# Patient Record
Sex: Female | Born: 1955 | Race: White | Hispanic: No
Health system: Western US, Academic
[De-identification: ages and names within clinical notes are randomized; demographics above are authoritative.]

## PROBLEM LIST (undated history)

## (undated) DIAGNOSIS — I10 Essential (primary) hypertension: Secondary | ICD-10-CM

## (undated) HISTORY — DX: Essential (primary) hypertension: I10

---

## 2018-02-05 ENCOUNTER — Other Ambulatory Visit: Payer: Self-pay | Admitting: Registered Nurse

## 2018-02-05 DIAGNOSIS — M79601 Pain in right arm: Principal | ICD-10-CM

## 2018-02-18 ENCOUNTER — Ambulatory Visit
Admission: RE | Admit: 2018-02-18 | Discharge: 2018-02-18 | Disposition: A | Discharge status: Home / Self Care | Source: Ambulatory Visit | Attending: Registered Nurse | Admitting: Registered Nurse

## 2018-02-18 DIAGNOSIS — M79601 Pain in right arm: Principal | ICD-10-CM

## 2018-04-15 ENCOUNTER — Other Ambulatory Visit: Payer: Self-pay | Admitting: Registered Nurse

## 2018-04-15 DIAGNOSIS — Z1239 Encounter for other screening for malignant neoplasm of breast: Principal | ICD-10-CM

## 2018-04-15 DIAGNOSIS — R221 Localized swelling, mass and lump, neck: Principal | ICD-10-CM

## 2018-04-29 ENCOUNTER — Ambulatory Visit
Admission: RE | Admit: 2018-04-29 | Discharge: 2018-04-29 | Disposition: A | Discharge status: Home / Self Care | Source: Ambulatory Visit

## 2018-04-29 ENCOUNTER — Ambulatory Visit
Admission: RE | Admit: 2018-04-29 | Discharge: 2018-04-29 | Disposition: A | Discharge status: Home / Self Care | Source: Ambulatory Visit | Attending: Registered Nurse | Admitting: Registered Nurse

## 2018-04-29 DIAGNOSIS — Z1239 Encounter for other screening for malignant neoplasm of breast: Principal | ICD-10-CM

## 2018-04-29 DIAGNOSIS — R221 Localized swelling, mass and lump, neck: Secondary | ICD-10-CM

## 2018-04-30 ENCOUNTER — Other Ambulatory Visit: Payer: Self-pay | Admitting: Registered Nurse

## 2018-04-30 DIAGNOSIS — N6320 Unspecified lump in the left breast, unspecified quadrant: Principal | ICD-10-CM

## 2018-04-30 DIAGNOSIS — E041 Nontoxic single thyroid nodule: Principal | ICD-10-CM

## 2018-05-06 NOTE — Nurse Focus (Signed)
Pre procedure call for thyroid biopsy, reminded to wear comfortable clothes, diet light lunch prior to procedure time, blood thinners not taking any , okay to take all other medications, arrive to [facility] 30 minutes prior to procedure time, sedation not needed, driver will be pt, lab not needed per protocol, procedure explained to pt, questions addressed, verbalizes understanding, given our call back number [phone] to call us with any further questions or concerns.

## 2018-05-07 ENCOUNTER — Ambulatory Visit
Admission: RE | Admit: 2018-05-07 | Discharge: 2018-05-07 | Disposition: A | Discharge status: Home / Self Care | Source: Ambulatory Visit | Attending: DIAGNOSTIC RADIOLOGY | Admitting: DIAGNOSTIC RADIOLOGY

## 2018-05-07 ENCOUNTER — Ambulatory Visit
Admission: RE | Admit: 2018-05-07 | Discharge: 2018-05-07 | Disposition: A | Discharge status: Home / Self Care | Source: Ambulatory Visit

## 2018-05-07 DIAGNOSIS — E041 Nontoxic single thyroid nodule: Principal | ICD-10-CM

## 2018-05-07 MED ORDER — SODIUM BICARBONATE 1 MEQ/ML (8.4 %) INTRAVENOUS SOLUTION
50.0000 meq | Freq: Once | INTRAVENOUS | Status: AC
Start: 2018-05-07 — End: 2018-05-07
  Administered 2018-05-07: 0.5 meq
  Filled 2018-05-07: qty 50, fill #0

## 2018-05-07 MED ORDER — LIDOCAINE HCL 10 MG/ML (1 %) INJECTION SOLUTION
0.5000 mL | Freq: Once | INTRAMUSCULAR | Status: AC
Start: 2018-05-07 — End: 2018-05-07
  Administered 2018-05-07: 5 mL

## 2018-05-07 NOTE — Nurse Discharge Note (Signed)
Patient tolerated thyroid bx well. Dressing per protocol. Ice pack applied Patient free from unusual  Pain/nausea/bleeding. Discharge instructions given and understood.   Discharged home ambulatory.

## 2018-05-07 NOTE — Nurse Focus (Signed)
Pt seen by md consent obtained.

## 2018-05-08 ENCOUNTER — Ambulatory Visit
Admission: RE | Admit: 2018-05-08 | Discharge: 2018-05-08 | Disposition: A | Discharge status: Home / Self Care | Source: Ambulatory Visit | Attending: Registered Nurse | Admitting: Registered Nurse

## 2018-05-08 DIAGNOSIS — N6320 Unspecified lump in the left breast, unspecified quadrant: Principal | ICD-10-CM

## 2018-05-08 NOTE — Nurse Focus (Addendum)
Post thyroid biopsy call, stated doing well, denies fever or bleeding, satisfied with care received here, Pt reports neck discomfort with turning and sore throat. Pt denies swelling or bruising. Pt speaking on phone without difficulty. Pt advised to use ice 20 minutes on and 10 minutes off, as needed for pain. Additionally, pt advised to use throat lozenges for sore throat. Pt aware, if there are any further concerns please contact your primary care doctor.

## 2018-09-02 ENCOUNTER — Other Ambulatory Visit: Payer: Self-pay | Admitting: Registered Nurse

## 2018-09-02 DIAGNOSIS — J449 Chronic obstructive pulmonary disease, unspecified: Principal | ICD-10-CM

## 2018-09-10 ENCOUNTER — Other Ambulatory Visit: Payer: Self-pay | Admitting: Registered Nurse

## 2018-09-10 ENCOUNTER — Ambulatory Visit
Admission: RE | Admit: 2018-09-10 | Discharge: 2018-09-10 | Disposition: A | Discharge status: Home / Self Care | Source: Ambulatory Visit | Attending: Registered Nurse | Admitting: Registered Nurse

## 2018-09-10 DIAGNOSIS — J449 Chronic obstructive pulmonary disease, unspecified: Principal | ICD-10-CM

## 2018-09-18 ENCOUNTER — Ambulatory Visit
Admission: RE | Admit: 2018-09-18 | Discharge: 2018-09-18 | Disposition: A | Discharge status: Home / Self Care | Source: Ambulatory Visit | Attending: Registered Nurse | Admitting: Registered Nurse

## 2018-09-18 DIAGNOSIS — J449 Chronic obstructive pulmonary disease, unspecified: Principal | ICD-10-CM

## 2018-09-18 MED ORDER — ALBUTEROL SULFATE CONCENTRATE 2.5 MG/0.5 ML SOLUTION FOR NEBULIZATION
2.5000 mg | INHALATION_SOLUTION | RESPIRATORY_TRACT | Status: AC | PRN
Start: 2018-09-18 — End: 2018-09-18
  Administered 2018-09-18: 2.5 mg via RESPIRATORY_TRACT
  Filled 2018-09-18: qty 0.5, fill #0

## 2018-09-18 MED ORDER — SODIUM CHLORIDE 0.9 % FOR NEBULIZATION
3.0000 mL | INHALATION_SOLUTION | RESPIRATORY_TRACT | Status: AC | PRN
Start: 2018-09-18 — End: 2018-09-18
  Administered 2018-09-18: 3 mL via RESPIRATORY_TRACT
  Filled 2018-09-18: qty 3, fill #0

## 2018-10-11 NOTE — Procedures (Signed)
Pulmonary function testing    Date: September 18, 2018    Diagnosis: Chronic obstructive pulmonary disease    The FVC is 2.09 L or 68% of predicted.  The FEV1 is 1.7 L or 70% of predicted.  After albuterol by nebulizer the FEV1 improved by 10% and the FEF 25-75% improved by 54%.  This is significant improvement.  The flow volume loops show a mild to moderate restrictive pattern.  The total lung capacity is 4.56 L or 90% of predicted.  The residual volume is 2.46 L or 120% of predicted.  The diffusion capacity is 17.8 or 89% of predicted.    Impression: Moderate restrictive pattern spirometry with significant improvement after bronchodilator.  Lung volume testing and diffusion capacity testing are normal.  This may represent the double diagnoses of COPD and obesity.

## 2019-02-02 ENCOUNTER — Other Ambulatory Visit: Payer: Self-pay | Admitting: Registered Nurse

## 2019-02-02 DIAGNOSIS — R42 Dizziness and giddiness: Principal | ICD-10-CM

## 2019-04-30 ENCOUNTER — Other Ambulatory Visit: Payer: Self-pay | Admitting: Registered Nurse

## 2019-04-30 DIAGNOSIS — H8109 Meniere's disease, unspecified ear: Secondary | ICD-10-CM

## 2019-06-16 ENCOUNTER — Other Ambulatory Visit: Payer: Self-pay | Admitting: Registered Nurse

## 2019-06-16 DIAGNOSIS — E041 Nontoxic single thyroid nodule: Secondary | ICD-10-CM

## 2019-07-06 ENCOUNTER — Other Ambulatory Visit: Payer: Self-pay | Admitting: Registered Nurse

## 2019-07-06 DIAGNOSIS — R42 Dizziness and giddiness: Secondary | ICD-10-CM

## 2019-07-27 ENCOUNTER — Other Ambulatory Visit: Payer: Self-pay

## 2019-07-27 ENCOUNTER — Other Ambulatory Visit: Payer: Self-pay | Admitting: Registered Nurse

## 2019-07-27 DIAGNOSIS — Z1231 Encounter for screening mammogram for malignant neoplasm of breast: Secondary | ICD-10-CM

## 2019-09-03 ENCOUNTER — Ambulatory Visit: Admitting: Family

## 2019-09-03 DIAGNOSIS — Z20828 Contact with and (suspected) exposure to other viral communicable diseases: Secondary | ICD-10-CM

## 2019-10-02 ENCOUNTER — Ambulatory Visit
Admission: RE | Admit: 2019-10-02 | Discharge: 2019-10-02 | Disposition: A | Discharge status: Home / Self Care | Source: Ambulatory Visit | Attending: Registered Nurse | Admitting: Registered Nurse

## 2019-10-02 DIAGNOSIS — E041 Nontoxic single thyroid nodule: Secondary | ICD-10-CM | POA: Insufficient documentation

## 2019-12-28 ENCOUNTER — Other Ambulatory Visit: Payer: Self-pay | Admitting: Registered Nurse

## 2019-12-28 DIAGNOSIS — S86911A Strain of unspecified muscle(s) and tendon(s) at lower leg level, right leg, initial encounter: Secondary | ICD-10-CM

## 2019-12-29 ENCOUNTER — Ambulatory Visit
Admission: RE | Admit: 2019-12-29 | Discharge: 2019-12-29 | Disposition: A | Discharge status: Home / Self Care | Source: Ambulatory Visit | Attending: Registered Nurse | Admitting: Registered Nurse

## 2019-12-29 DIAGNOSIS — S86911A Strain of unspecified muscle(s) and tendon(s) at lower leg level, right leg, initial encounter: Secondary | ICD-10-CM | POA: Insufficient documentation

## 2020-04-27 ENCOUNTER — Other Ambulatory Visit: Payer: Self-pay | Admitting: Registered Nurse

## 2020-04-27 DIAGNOSIS — M79601 Pain in right arm: Secondary | ICD-10-CM

## 2020-05-05 ENCOUNTER — Ambulatory Visit

## 2020-07-04 ENCOUNTER — Other Ambulatory Visit: Payer: Self-pay | Admitting: Registered Nurse

## 2020-07-04 DIAGNOSIS — M79602 Pain in left arm: Secondary | ICD-10-CM

## 2020-07-05 ENCOUNTER — Ambulatory Visit
Admission: RE | Admit: 2020-07-05 | Discharge: 2020-07-05 | Disposition: A | Discharge status: Home / Self Care | Source: Ambulatory Visit | Attending: Registered Nurse | Admitting: Registered Nurse

## 2020-07-05 DIAGNOSIS — M79602 Pain in left arm: Secondary | ICD-10-CM | POA: Insufficient documentation

## 2020-07-13 LAB — CBC AND DIFFERENTIAL
HCT: 43 (ref 36–46)
Hemoglobin: 15.3 (ref 12.0–16.0)
Platelets: 255 (ref 150–399)
WBC: 9.7

## 2020-07-13 LAB — BASIC METABOLIC PANEL
BUN: 14 (ref 4–21)
CO2: 33 — AB (ref 13–22)
Chloride: 101 (ref 99–108)
Creatinine: 0.8 (ref <=1.1)
Glucose: 110
Potassium: 4.3 (ref 3.4–5.3)
Sodium: 139 (ref 137–147)

## 2020-07-13 LAB — CBC: RBC: 4.9 (ref 3.87–5.11)

## 2020-07-13 LAB — LIPID PANEL
Cholesterol: 215 — AB (ref 0–200)
Triglycerides: 103 (ref 40–160)

## 2020-07-13 LAB — COMPREHENSIVE METABOLIC PANEL
Albumin: 4.3 (ref 3.5–5.0)
Calcium: 9.6 (ref 8.7–10.7)
Globulin: 2.8

## 2020-07-13 LAB — HEPATIC FUNCTION PANEL
ALT: 17 (ref 7–35)
AST: 18 (ref 13–35)
Alkaline Phosphatase: 91 (ref 25–125)
Bilirubin, Total: 0.8

## 2020-08-16 ENCOUNTER — Other Ambulatory Visit: Payer: Self-pay | Admitting: Registered Nurse

## 2020-08-16 DIAGNOSIS — Z1231 Encounter for screening mammogram for malignant neoplasm of breast: Secondary | ICD-10-CM

## 2020-08-23 ENCOUNTER — Ambulatory Visit: Payer: Medicare Other

## 2020-08-25 ENCOUNTER — Ambulatory Visit: Payer: Medicare Other

## 2020-09-12 ENCOUNTER — Ambulatory Visit
Admission: RE | Admit: 2020-09-12 | Discharge: 2020-09-12 | Disposition: A | Discharge status: Home / Self Care | Payer: Medicare Other | Source: Ambulatory Visit | Attending: Registered Nurse | Admitting: Registered Nurse

## 2020-09-12 DIAGNOSIS — Z1231 Encounter for screening mammogram for malignant neoplasm of breast: Secondary | ICD-10-CM | POA: Insufficient documentation

## 2021-09-04 ENCOUNTER — Ambulatory Visit: Payer: Self-pay | Admitting: Nurse Practitioner

## 2021-09-18 ENCOUNTER — Other Ambulatory Visit: Payer: Self-pay

## 2021-09-18 ENCOUNTER — Ambulatory Visit (INDEPENDENT_AMBULATORY_CARE_PROVIDER_SITE_OTHER): Payer: Medicare HMO | Admitting: Nurse Practitioner

## 2021-09-18 ENCOUNTER — Encounter: Payer: Self-pay | Admitting: Nurse Practitioner

## 2021-09-18 VITALS — BP 128/81 | HR 92 | Temp 98.2°F | Ht 64.0 in | Wt 199.8 lb

## 2021-09-18 DIAGNOSIS — E041 Nontoxic single thyroid nodule: Secondary | ICD-10-CM | POA: Diagnosis not present

## 2021-09-18 DIAGNOSIS — M25511 Pain in right shoulder: Secondary | ICD-10-CM

## 2021-09-18 DIAGNOSIS — Z7689 Persons encountering health services in other specified circumstances: Secondary | ICD-10-CM

## 2021-09-18 DIAGNOSIS — J449 Chronic obstructive pulmonary disease, unspecified: Secondary | ICD-10-CM

## 2021-09-18 DIAGNOSIS — I10 Essential (primary) hypertension: Secondary | ICD-10-CM

## 2021-09-18 DIAGNOSIS — Z6834 Body mass index (BMI) 34.0-34.9, adult: Secondary | ICD-10-CM | POA: Diagnosis not present

## 2021-09-18 DIAGNOSIS — Z6837 Body mass index (BMI) 37.0-37.9, adult: Secondary | ICD-10-CM | POA: Insufficient documentation

## 2021-09-18 DIAGNOSIS — G8929 Other chronic pain: Secondary | ICD-10-CM | POA: Diagnosis not present

## 2021-09-18 DIAGNOSIS — E119 Type 2 diabetes mellitus without complications: Secondary | ICD-10-CM

## 2021-09-18 DIAGNOSIS — Z6836 Body mass index (BMI) 36.0-36.9, adult: Secondary | ICD-10-CM | POA: Insufficient documentation

## 2021-09-18 DIAGNOSIS — I152 Hypertension secondary to endocrine disorders: Secondary | ICD-10-CM | POA: Insufficient documentation

## 2021-09-18 DIAGNOSIS — H6121 Impacted cerumen, right ear: Secondary | ICD-10-CM | POA: Diagnosis not present

## 2021-09-18 MED ORDER — ALBUTEROL SULFATE (2.5 MG/3ML) 0.083% IN NEBU: INHALATION_SOLUTION | RESPIRATORY_TRACT | 12 refills | Status: DC

## 2021-09-18 MED ORDER — FLUTICASONE PROPIONATE HFA 110 MCG/ACT IN AERO: 1.0000 | INHALATION_SPRAY | Freq: Four times a day (QID) | RESPIRATORY_TRACT | 12 refills | Status: DC

## 2021-09-18 NOTE — Patient Instructions (Signed)
Fat and Cholesterol Restricted Eating Plan Getting too much fat and cholesterol in your diet may cause health problems. Choosing the right foods helps keep your fat and cholesterol at normal levels. This can keep you from getting certain diseases. Your doctor may recommend an eating plan that includes: Total fat: ______% or less of total calories a day. This is ______g of fat a day. Saturated fat: ______% or less of total calories a day. This is ______g of saturated fat a day. Cholesterol: less than _________mg a day. Fiber: ______g a day. What are tips for following this plan? General tips Work with your doctor to lose weight if you need to. Avoid: Foods with added sugar. Fried foods. Foods with trans fat or partially hydrogenated oils. This includes some margarines and baked goods. If you drink alcohol: Limit how much you have to: 0-1 drink a day for women who are not pregnant. 0-2 drinks a day for men. Know how much alcohol is in a drink. In the [place] one drink equals one 12 oz bottle of beer (355 mL), one 5 oz glass of wine (148 mL), or one 1 oz glass of hard liquor (44 mL). Reading food labels Check food labels for: Trans fats. Partially hydrogenated oils. Saturated fat (g) in each serving. Cholesterol (mg) in each serving. Fiber (g) in each serving. Choose foods with healthy fats, such as: Monounsaturated fats and polyunsaturated fats. These include olive and canola oil, flaxseeds, walnuts, almonds, and seeds. Omega-3 fats. These are found in certain fish, flaxseed oil, and ground flaxseeds. Choose grain products that have whole grains. Look for the word "whole" as the first word in the ingredient list. Cooking Cook foods using low-fat methods. These include baking, boiling, grilling, and broiling. Eat more home-cooked foods. Eat at restaurants and buffets less often. Eat less fast food. Avoid cooking using saturated fats, such as butter, cream, palm oil, palm kernel oil, and  coconut oil. Meal planning  At meals, divide your plate into four equal parts: Fill one-half of your plate with vegetables, green salads, and fruit. Fill one-fourth of your plate with whole grains. Fill one-fourth of your plate with low-fat (lean) protein foods. Eat fish that is high in omega-3 fats at least two times a week. This includes mackerel, tuna, sardines, and salmon. Eat foods that are high in fiber, such as whole grains, beans, apples, pears, berries, broccoli, carrots, peas, and barley. What foods should I eat? Fruits All fresh, canned (in natural juice), or frozen fruits. Vegetables Fresh or frozen vegetables (raw, steamed, roasted, or grilled). Green salads. Grains Whole grains, such as whole wheat or whole grain breads, crackers, cereals, and pasta. Unsweetened oatmeal, bulgur, barley, quinoa, or brown rice. Corn or whole wheat flour tortillas. Meats and other protein foods Ground beef (85% or leaner), grass-fed beef, or beef trimmed of fat. Skinless chicken or turkey. Ground chicken or turkey. Pork trimmed of fat. All fish and seafood. Egg whites. Dried beans, peas, or lentils. Unsalted nuts or seeds. Unsalted canned beans. Nut butters without added sugar or oil. Dairy Low-fat or nonfat dairy products, such as skim or 1% milk, 2% or reduced-fat cheeses, low-fat and fat-free ricotta or cottage cheese, or plain low-fat and nonfat yogurt. Fats and oils Tub margarine without trans fats. Light or reduced-fat mayonnaise and salad dressings. Avocado. Olive, canola, sesame, or safflower oils. The items listed above may not be a complete list of foods and beverages you can eat. Contact a dietitian for more information. What foods   should I avoid? Fruits Canned fruit in heavy syrup. Fruit in cream or butter sauce. Fried fruit. Vegetables Vegetables cooked in cheese, cream, or butter sauce. Fried vegetables. Grains White bread. White pasta. White rice. Cornbread. Bagels, pastries,  and croissants. Crackers and snack foods that contain trans fat and hydrogenated oils. Meats and other protein foods Fatty cuts of meat. Ribs, chicken wings, bacon, sausage, bologna, salami, chitterlings, fatback, hot dogs, bratwurst, and packaged lunch meats. Liver and organ meats. Whole eggs and egg yolks. Chicken and turkey with skin. Fried meat. Dairy Whole or 2% milk, cream, half-and-half, and cream cheese. Whole milk cheeses. Whole-fat or sweetened yogurt. Full-fat cheeses. Nondairy creamers and whipped toppings. Processed cheese, cheese spreads, and cheese curds. Fats and oils Butter, stick margarine, lard, shortening, ghee, or bacon fat. Coconut, palm kernel, and palm oils. Beverages Alcohol. Sugar-sweetened drinks such as sodas, lemonade, and fruit drinks. Sweets and desserts Corn syrup, sugars, honey, and molasses. Candy. Jam and jelly. Syrup. Sweetened cereals. Cookies, pies, cakes, donuts, muffins, and ice cream. The items listed above may not be a complete list of foods and beverages you should avoid. Contact a dietitian for more information. Summary Choosing the right foods helps keep your fat and cholesterol at normal levels. This can keep you from getting certain diseases. At meals, fill one-half of your plate with vegetables, green salads, and fruits. Eat high fiber foods, like whole grains, beans, apples, pears, berries, carrots, peas, and barley. Limit added sugar, saturated fats, alcohol, and fried foods. This information is not intended to replace advice given to you by your health care provider. Make sure you discuss any questions you have with your health care provider. Document Revised: 02/10/2021 Document Reviewed: 02/10/2021 Elsevier Patient Education  2022 Elsevier Inc.  

## 2021-09-18 NOTE — Progress Notes (Signed)
New Patient Office Visit  Subjective:  Patient ID: Angela Mcintyre, female    DOB: 23-Mar-1956  Age: 65 y.o. MRN: [id number]  CC:  Chief Complaint  Patient presents with   New Patient (Initial Visit)    HPI Angela Mcintyre presents to establish new primary care provider. She has recently moved to the area from [place]. She has history of hypertension and COPD. She has had a complete hysterectomy. She states that she has a thyroid nodule on the right lobe of the thyroid. She believes the last ultrasound was about 6 months ago. She was not notified of any changes. She believes she will be due to have routine, fasting labs and annual wellness visit toward the beginning of the next year. Last time she had blood work done, she had mild diabetes. She states that trial of metformin made her very sick. Is now managing through diet and lifestyle changes. Checks her blood sugars daily. Sugar generally runs between 95 and 135. She has had a recent mammogram, within the last year. Will request records from previous PCP in [place] to review and update chart.  She states that she his having right arm pain from shoulder to the wrist. Hurts more when she is picking up or lifting things with weight. This is most severe from the shoulder to the elbow. Sleeping and getting comfortable is difficult. Has become worse since move to [place].  Has history of Meniere's disease. Has trouble with her right ear. Has a great deal of wax build up which generally needs to be removed rather than lavaged. Water in the ear makes her nauseated to the point of vomiting.   Past Medical History:  Diagnosis Date   High blood pressure     Past Surgical History:  Procedure Laterality Date   CESAREAN SECTION  1973   CESAREAN SECTION      History reviewed. No pertinent family history.  Social History   Socioeconomic History   Marital status: Single    Spouse name: Not on file   Number of children: Not on file    Years of education: Not on file   Highest education level: Not on file  Occupational History   Not on file  Tobacco Use   Smoking status: Former    Types: Cigarettes   Smokeless tobacco: Never  Substance and Sexual Activity   Alcohol use: Not Currently   Drug use: Not Currently   Sexual activity: Yes    Partners: Male  Other Topics Concern   Not on file  Social History Narrative   Not on file   Social Determinants of Health   Financial Resource Strain: Not on file  Food Insecurity: Not on file  Transportation Needs: Not on file  Physical Activity: Not on file  Stress: Not on file  Social Connections: Not on file  Intimate Partner Violence: Not on file    ROS Review of Systems  Constitutional:  Negative for activity change, appetite change, chills, fatigue and fever.  HENT:  Positive for ear discharge and ear pain. Negative for congestion, postnasal drip, rhinorrhea, sinus pressure, sinus pain, sneezing and sore throat.        Feels wax build-up in her right ear. Not painful. Irritating to her.   Eyes: Negative.   Respiratory:  Positive for cough. Negative for chest tightness, shortness of breath and wheezing.        Increaed cough since weather turned cold.   Cardiovascular:  Negative for chest pain and  palpitations.  Gastrointestinal:  Negative for abdominal pain, constipation, diarrhea, nausea and vomiting.  Endocrine: Negative for cold intolerance, heat intolerance, polydipsia and polyuria.       Reports blood sugars between 90 and 135  Genitourinary:  Negative for dyspareunia, dysuria, flank pain, frequency and urgency.  Musculoskeletal:  Positive for arthralgias and myalgias. Negative for back pain.       Right shoulder and arm pain from shoulder to wrist.  Hurts more with picking up things or lifting anything with weight.  Skin:  Negative for rash.  Allergic/Immunologic: Positive for environmental allergies.  Neurological:  Negative for dizziness, weakness and  headaches.  Hematological:  Negative for adenopathy.  Psychiatric/Behavioral:  The patient is not nervous/anxious.    Objective:   Today's Vitals   09/18/21 1437  BP: 128/81  Pulse: 92  Temp: 98.2 F (36.8 C)  SpO2: 93%  Weight: 199 lb 12.8 oz (90.6 kg)  Height: 5\' 4"  (1.626 m)   Body mass index is 34.3 kg/m.   Physical Exam Vitals and nursing note reviewed.  Constitutional:      Appearance: Normal appearance. She is well-developed. She is obese.  HENT:     Head: Normocephalic and atraumatic.     Ears:     Comments: There is some cerumen without impaction present. Some redness present in bilateral external ear canals.  Eyes:     Pupils: Pupils are equal, round, and reactive to light.  Cardiovascular:     Rate and Rhythm: Normal rate and regular rhythm.     Pulses: Normal pulses.     Heart sounds: Normal heart sounds.  Pulmonary:     Effort: Pulmonary effort is normal.     Breath sounds: Wheezing present.     Comments: Mild wheezing noted throughout the lung fields.  Abdominal:     Palpations: Abdomen is soft.  Musculoskeletal:        General: Normal range of motion.     Cervical back: Normal range of motion and neck supple.  Lymphadenopathy:     Cervical: No cervical adenopathy.  Skin:    General: Skin is warm and dry.     Capillary Refill: Capillary refill takes less than 2 seconds.  Neurological:     General: No focal deficit present.     Mental Status: She is alert and oriented to person, place, and time.  Psychiatric:        Mood and Affect: Mood normal.        Behavior: Behavior normal.        Thought Content: Thought content normal.        Judgment: Judgment normal.    Assessment & Plan:  1. Encounter to establish care Appointment today to establish new primary care provider. Will get records from previous providers for review and update patient chart .  2. Chronic right shoulder pain Patient reporting an increase in pain from right shoulder to the  right elbow.  This is particularly worse at night when trying to sleep or get comfortable.  She recalls an x-ray showing no acute abnormalities within the last year.  Pain and pulling sensation seems to getting worse.  Range of motion and strength is declining.  We will get new x-ray of the right shoulder for further evaluation.  If MRI needed, we will have to get open MRI due to claustrophobia. - DG Shoulder 1V Right; Future  3. Essential hypertension Stable.  Continue metoprolol as prescribed.  4. Diet-controlled type 2 diabetes mellitus (  HCC) Currently not taking medication to control diabetes.  She is limiting carbohydrates and sugar in her diet.  Checking blood sugars daily.  Generally running between 90 and 135.  We will check hemoglobin A1c prior to next visit and treat as indicated.  5. Thyroid nodule Patient recalls recent thyroid ultrasound showing no changes in the thyroid nodule on the right lobe of the thyroid.  We will get medical records from prior PCP to review and update chart.  We will schedule new thyroid ultrasound as indicated.  6. Impacted cerumen of right ear There is excess earwax in the right ear without impaction.  Small amount of cerumen removed with earwax removal tool.  No redness or irritation present after attempt to remove the wax.  Canal clear.  Will monitor.  7. Chronic obstructive pulmonary disease, unspecified COPD type (HCC) Wheezing heard throughout the lung fields.  Patient reports using Flovent 110 4 times daily.  She does have albuterol nebulizer at home which she will start using as needed.  We will reassess need for maintenance inhaler at next visit.  Consider chest CT/lung cancer screening at next visit. - fluticasone (FLOVENT HFA) 110 MCG/ACT inhaler; Inhale 1 puff into the lungs 4 (four) times daily.  Dispense: 1 each; Refill: 12  8. Body mass index (BMI) of 34.0-34.9 in adult Discussed lowering calorie intake to 1500 calories per day and  incorporating exercise into daily routine to help lose weight. Will monitor.     Problem List Items Addressed This Visit       Cardiovascular and Mediastinum   Essential hypertension   Relevant Medications   metoprolol tartrate (LOPRESSOR) 25 MG tablet     Respiratory   Chronic obstructive pulmonary disease (HCC)   Relevant Medications   fluticasone (FLOVENT HFA) 110 MCG/ACT inhaler     Endocrine   Diet-controlled type 2 diabetes mellitus (HCC)   Thyroid nodule   Relevant Medications   metoprolol tartrate (LOPRESSOR) 25 MG tablet     Nervous and Auditory   Impacted cerumen of right ear     Other   Chronic right shoulder pain   Relevant Medications   cyclobenzaprine (FLEXERIL) 10 MG tablet   sertraline (ZOLOFT) 50 MG tablet   Other Relevant Orders   DG Shoulder 1V Right   Body mass index (BMI) of 34.0-34.9 in adult   Other Visit Diagnoses     Encounter to establish care    -  Primary       Outpatient Encounter Medications as of 09/18/2021  Medication Sig   cyclobenzaprine (FLEXERIL) 10 MG tablet Take 10 mg by mouth 3 (three) times daily as needed.   fluticasone (FLOVENT HFA) 110 MCG/ACT inhaler Inhale 1 puff into the lungs 4 (four) times daily.   metoprolol tartrate (LOPRESSOR) 25 MG tablet Take 25 mg by mouth 2 (two) times daily.   sertraline (ZOLOFT) 50 MG tablet Take 50 mg by mouth daily.   No facility-administered encounter medications on file as of 09/18/2021.    Follow-up: Return in about 3 months (around 12/17/2021) for health maintenance exam, FBW a week prior to visit - need records from providers in [place] .   Carlean Jews, NP

## 2021-09-29 ENCOUNTER — Telehealth: Payer: Self-pay | Admitting: Nurse Practitioner

## 2021-09-29 ENCOUNTER — Encounter: Payer: Self-pay | Admitting: Nurse Practitioner

## 2021-09-29 ENCOUNTER — Other Ambulatory Visit: Payer: Self-pay | Admitting: Nurse Practitioner

## 2021-09-29 NOTE — Telephone Encounter (Signed)
Patient states she has COPD and is having excess coughing that is keeping her up at night. She states her stats are 94% on room air. She denies any SOB or difficulty breathing but states that it is a little harder to breathe at night when shes trying to sleep.   I advised patient to sleep in a recliner or in an upright position.   Patient is requesting something for the cough. AS, CMA

## 2021-09-29 NOTE — Telephone Encounter (Signed)
Attempted to contact patient with no answer. AS< CMA

## 2021-09-29 NOTE — Telephone Encounter (Signed)
Does she have a pharmacy I can send prescriptions? I will send in medrol taper and tessalon perls

## 2021-10-27 ENCOUNTER — Telehealth: Payer: Self-pay | Admitting: Nurse Practitioner

## 2021-10-27 ENCOUNTER — Other Ambulatory Visit: Payer: Self-pay | Admitting: Nurse Practitioner

## 2021-10-27 DIAGNOSIS — F411 Generalized anxiety disorder: Secondary | ICD-10-CM

## 2021-10-27 DIAGNOSIS — G8929 Other chronic pain: Secondary | ICD-10-CM

## 2021-10-27 DIAGNOSIS — M25511 Pain in right shoulder: Secondary | ICD-10-CM

## 2021-10-27 MED ORDER — CYCLOBENZAPRINE HCL 10 MG PO TABS: 10.0000 mg | ORAL_TABLET | Freq: Two times a day (BID) | ORAL | 2 refills | Status: DC | PRN

## 2021-10-27 MED ORDER — SERTRALINE HCL 50 MG PO TABS: 50.0000 mg | ORAL_TABLET | Freq: Every day | ORAL | 2 refills | Status: DC

## 2021-10-27 NOTE — Telephone Encounter (Signed)
I refilled her cyclobenzaprine and her zoloft. She did not have HCTZ on her medication list. Can you find out the dose for me? I will add it to her list and refill that as well. I placed referral to psychiatry as well.  Thanks so much.   -HB

## 2021-10-27 NOTE — Telephone Encounter (Signed)
Patient request refills of her Lorazapam, cyclobenzaprine, sertraline and the HCTZ. Please advise. [phone]

## 2021-10-27 NOTE — Telephone Encounter (Signed)
Left message for patient to call back.   Patient has previously been seen for a new patient appointment. We have not prescribed any of these medications before. The Lorazapam was not discussed at establishing appointment. We are unable to maintain that medication since it is a controlled substance-per our controlled substance contract. Patient can be referred to Psych if needed.

## 2021-10-27 NOTE — Telephone Encounter (Signed)
Please refill Cyclobenzaprine, zoloft and HCTZ if you feel these refills are appropriate. Also, see the below message. AS, CMA

## 2021-12-12 ENCOUNTER — Other Ambulatory Visit: Payer: Self-pay

## 2021-12-12 DIAGNOSIS — Z0000 Encounter for general adult medical examination without abnormal findings: Secondary | ICD-10-CM

## 2021-12-12 DIAGNOSIS — Z130 Encounter for screening for diseases of the blood and blood-forming organs and certain disorders involving the immune mechanism: Secondary | ICD-10-CM

## 2021-12-12 DIAGNOSIS — Z1321 Encounter for screening for nutritional disorder: Secondary | ICD-10-CM

## 2021-12-14 ENCOUNTER — Other Ambulatory Visit: Payer: Self-pay

## 2021-12-14 ENCOUNTER — Other Ambulatory Visit: Payer: Medicare Other

## 2021-12-14 DIAGNOSIS — Z130 Encounter for screening for diseases of the blood and blood-forming organs and certain disorders involving the immune mechanism: Secondary | ICD-10-CM | POA: Diagnosis not present

## 2021-12-14 DIAGNOSIS — Z1329 Encounter for screening for other suspected endocrine disorder: Secondary | ICD-10-CM | POA: Diagnosis not present

## 2021-12-14 DIAGNOSIS — Z13228 Encounter for screening for other metabolic disorders: Secondary | ICD-10-CM | POA: Diagnosis not present

## 2021-12-14 DIAGNOSIS — Z0000 Encounter for general adult medical examination without abnormal findings: Secondary | ICD-10-CM | POA: Diagnosis not present

## 2021-12-14 DIAGNOSIS — Z1321 Encounter for screening for nutritional disorder: Secondary | ICD-10-CM

## 2021-12-15 LAB — COMPREHENSIVE METABOLIC PANEL
ALT: 16 IU/L (ref 0–32)
AST: 18 IU/L (ref 0–40)
Albumin/Globulin Ratio: 1.7 (ref 1.2–2.2)
Albumin: 4.1 g/dL (ref 3.8–4.8)
Alkaline Phosphatase: 99 IU/L (ref 44–121)
BUN/Creatinine Ratio: 19 (ref 12–28)
BUN: 14 mg/dL (ref 8–27)
Bilirubin Total: 0.4 mg/dL (ref 0.0–1.2)
CO2: 24 mmol/L (ref 20–29)
Calcium: 9.1 mg/dL (ref 8.7–10.3)
Chloride: 101 mmol/L (ref 96–106)
Creatinine, Ser: 0.74 mg/dL (ref 0.57–1.00)
Globulin, Total: 2.4 g/dL (ref 1.5–4.5)
Glucose: 128 mg/dL — ABNORMAL HIGH (ref 70–99)
Potassium: 4 mmol/L (ref 3.5–5.2)
Sodium: 145 mmol/L — ABNORMAL HIGH (ref 134–144)
Total Protein: 6.5 g/dL (ref 6.0–8.5)
eGFR: 89 mL/min/{1.73_m2} (ref >59)

## 2021-12-15 LAB — LIPID PANEL
Chol/HDL Ratio: 4.1 ratio (ref 0.0–4.4)
Cholesterol, Total: 181 mg/dL (ref 100–199)
HDL: 44 mg/dL (ref >39)
LDL Chol Calc (NIH): 107 mg/dL — ABNORMAL HIGH (ref 0–99)
Triglycerides: 173 mg/dL — ABNORMAL HIGH (ref 0–149)
VLDL Cholesterol Cal: 30 mg/dL (ref 5–40)

## 2021-12-15 LAB — CBC
Hematocrit: 41.9 % (ref 34.0–46.6)
Hemoglobin: 14 g/dL (ref 11.1–15.9)
MCH: 29.5 pg (ref 26.6–33.0)
MCHC: 33.4 g/dL (ref 31.5–35.7)
MCV: 88 fL (ref 79–97)
Platelets: 242 10*3/uL (ref 150–450)
RBC: 4.75 x10E6/uL (ref 3.77–5.28)
RDW: 13.1 % (ref 11.7–15.4)
WBC: 9.6 10*3/uL (ref 3.4–10.8)

## 2021-12-15 LAB — TSH: TSH: 2.01 u[IU]/mL (ref 0.450–4.500)

## 2021-12-15 LAB — HEMOGLOBIN A1C
Est. average glucose Bld gHb Est-mCnc: 151 mg/dL
Hgb A1c MFr Bld: 6.9 % — ABNORMAL HIGH (ref 4.8–5.6)

## 2021-12-15 NOTE — Progress Notes (Signed)
Review labs with patient at visit 12/21/2021

## 2021-12-21 ENCOUNTER — Encounter: Payer: Self-pay | Admitting: Nurse Practitioner

## 2021-12-21 ENCOUNTER — Ambulatory Visit (INDEPENDENT_AMBULATORY_CARE_PROVIDER_SITE_OTHER): Payer: Medicare Other | Admitting: Nurse Practitioner

## 2021-12-21 ENCOUNTER — Other Ambulatory Visit: Payer: Self-pay

## 2021-12-21 ENCOUNTER — Telehealth: Payer: Self-pay | Admitting: Nurse Practitioner

## 2021-12-21 VITALS — BP 110/74 | HR 70 | Temp 98.3°F | Ht 64.0 in | Wt 211.1 lb

## 2021-12-21 DIAGNOSIS — Z23 Encounter for immunization: Secondary | ICD-10-CM | POA: Diagnosis not present

## 2021-12-21 DIAGNOSIS — E119 Type 2 diabetes mellitus without complications: Secondary | ICD-10-CM | POA: Diagnosis not present

## 2021-12-21 DIAGNOSIS — Z1231 Encounter for screening mammogram for malignant neoplasm of breast: Secondary | ICD-10-CM | POA: Diagnosis not present

## 2021-12-21 DIAGNOSIS — Z6836 Body mass index (BMI) 36.0-36.9, adult: Secondary | ICD-10-CM

## 2021-12-21 DIAGNOSIS — F411 Generalized anxiety disorder: Secondary | ICD-10-CM

## 2021-12-21 DIAGNOSIS — J449 Chronic obstructive pulmonary disease, unspecified: Secondary | ICD-10-CM | POA: Diagnosis not present

## 2021-12-21 DIAGNOSIS — Z0001 Encounter for general adult medical examination with abnormal findings: Secondary | ICD-10-CM

## 2021-12-21 DIAGNOSIS — E2839 Other primary ovarian failure: Secondary | ICD-10-CM

## 2021-12-21 DIAGNOSIS — Z1159 Encounter for screening for other viral diseases: Secondary | ICD-10-CM | POA: Diagnosis not present

## 2021-12-21 DIAGNOSIS — E041 Nontoxic single thyroid nodule: Secondary | ICD-10-CM | POA: Diagnosis not present

## 2021-12-21 DIAGNOSIS — H8109 Meniere's disease, unspecified ear: Secondary | ICD-10-CM | POA: Diagnosis not present

## 2021-12-21 DIAGNOSIS — I10 Essential (primary) hypertension: Secondary | ICD-10-CM | POA: Diagnosis not present

## 2021-12-21 LAB — MICROALBUMIN / CREATININE URINE RATIO: Microalb Creat Ratio: NORMAL

## 2021-12-21 LAB — POCT UA - MICROALBUMIN

## 2021-12-21 LAB — MICROALBUMIN, URINE: Microalb, Ur: NORMAL

## 2021-12-21 MED ORDER — HYDROCHLOROTHIAZIDE 25 MG PO TABS: 25.0000 mg | ORAL_TABLET | Freq: Every day | ORAL | 1 refills | Status: DC

## 2021-12-21 MED ORDER — BREZTRI AEROSPHERE 160-9-4.8 MCG/ACT IN AERO: 2.0000 | INHALATION_SPRAY | Freq: Two times a day (BID) | RESPIRATORY_TRACT | 1 refills | Status: DC

## 2021-12-21 MED ORDER — LORAZEPAM 1 MG PO TABS: 1.0000 mg | ORAL_TABLET | Freq: Every day | ORAL | 0 refills | Status: DC | PRN

## 2021-12-21 NOTE — Telephone Encounter (Signed)
Patient requesting refill of Hctz 25 mg once daily. Please advise ?

## 2021-12-21 NOTE — Telephone Encounter (Signed)
Please let her know I added this to her med list and sent #[id number] HCTZ 25mg  tablets to [facility]. There is an additional refill also. Thanks so much.   -HB

## 2021-12-21 NOTE — Progress Notes (Signed)
Established patient visit   Patient: Angela Mcintyre   DOB: [birth year]   66 y.o. Female  MRN: [id number] Visit Date: 12/21/2021   Chief Complaint  Patient presents with   Annual Exam   Subjective    HPI  The patient is here for annual wellness visit.  -blood pressure well managed. -routine, fasting albs done.   -HgbA1c 6.9 with elevated blood sugar.   -LDL and triglyceride levels very elevated  -anxiety high. Has history of this along with Meniere's disease. Had been taking ativan 1mg  daily as needed. She has made appointment with psychiatry which is scheduled for 01/17/2022. She has been out of the ativan for some time.  -thyroid nodule.  Most recent thyroid ultrasound was doe over a year ago. Thyroid panel was normal.  -needs screening mammogram  -needs initial bone density test    Medications: Outpatient Medications Prior to Visit  Medication Sig   cyclobenzaprine (FLEXERIL) 10 MG tablet Take 1 tablet (10 mg total) by mouth 2 (two) times daily as needed.   fluticasone (FLOVENT HFA) 110 MCG/ACT inhaler Inhale 1 puff into the lungs 4 (four) times daily.   sertraline (ZOLOFT) 50 MG tablet Take 1 tablet (50 mg total) by mouth daily.   albuterol (PROVENTIL) (2.5 MG/3ML) 0.083% nebulizer solution Use 1 vial nebulized every 6 hours as needed for shortness of breath (Patient not taking: Reported on 12/21/2021)   metoprolol tartrate (LOPRESSOR) 25 MG tablet Take 25 mg by mouth 2 (two) times daily. (Patient not taking: Reported on 12/21/2021)   No facility-administered medications prior to visit.    Review of Systems  Constitutional:  Positive for fatigue. Negative for activity change, appetite change, chills and fever.  HENT:  Negative for congestion, postnasal drip, rhinorrhea, sinus pressure, sinus pain, sneezing and sore throat.   Eyes: Negative.   Respiratory:  Positive for shortness of breath and wheezing. Negative for cough and chest tightness.   Cardiovascular:  Negative for chest  pain and palpitations.  Gastrointestinal:  Negative for abdominal pain, constipation, diarrhea, nausea and vomiting.  Endocrine: Negative for cold intolerance, heat intolerance, polydipsia and polyuria.  Genitourinary:  Negative for dyspareunia, dysuria, flank pain, frequency and urgency.  Musculoskeletal:  Negative for arthralgias, back pain and myalgias.  Skin:  Negative for rash.  Allergic/Immunologic: Positive for environmental allergies.  Neurological:  Negative for dizziness, weakness and headaches.  Hematological:  Negative for adenopathy.  Psychiatric/Behavioral:  The patient is nervous/anxious.    Last CBC Lab Results  Component Value Date   WBC 9.6 12/14/2021   HGB 14.0 12/14/2021   HCT 41.9 12/14/2021   MCV 88 12/14/2021   MCH 29.5 12/14/2021   RDW 13.1 12/14/2021   PLT 242 12/14/2021   Last metabolic panel Lab Results  Component Value Date   GLUCOSE 128 (H) 12/14/2021   NA 145 (H) 12/14/2021   K 4.0 12/14/2021   CL 101 12/14/2021   CO2 24 12/14/2021   BUN 14 12/14/2021   CREATININE 0.74 12/14/2021   EGFR 89 12/14/2021   CALCIUM 9.1 12/14/2021   PROT 6.5 12/14/2021   ALBUMIN 4.1 12/14/2021   LABGLOB 2.4 12/14/2021   AGRATIO 1.7 12/14/2021   BILITOT 0.4 12/14/2021   ALKPHOS 99 12/14/2021   AST 18 12/14/2021   ALT 16 12/14/2021   Last lipids Lab Results  Component Value Date   CHOL 181 12/14/2021   HDL 44 12/14/2021   LDLCALC 107 (H) 12/14/2021   TRIG 173 (H) 12/14/2021   CHOLHDL 4.1 12/14/2021  Last hemoglobin A1c Lab Results  Component Value Date   HGBA1C 6.9 (H) 12/14/2021   Last thyroid functions Lab Results  Component Value Date   TSH 2.010 12/14/2021       Objective     Today's Vitals   12/21/21 0939  BP: 110/74  Pulse: 70  Temp: 98.3 F (36.8 C)  SpO2: 94%  Weight: 211 lb 1.6 oz (95.8 kg)   Body mass index is 36.24 kg/m.   BP Readings from Last 3 Encounters:  12/21/21 110/74  09/18/21 128/81    Wt Readings from Last 3  Encounters:  12/21/21 211 lb 1.6 oz (95.8 kg)  09/18/21 199 lb 12.8 oz (90.6 kg)    Physical Exam Vitals and nursing note reviewed.  Constitutional:      Appearance: Normal appearance. She is well-developed. She is obese.  HENT:     Head: Normocephalic and atraumatic.     Right Ear: Tympanic membrane, ear canal and external ear normal.     Left Ear: Tympanic membrane, ear canal and external ear normal.     Nose: Nose normal.     Mouth/Throat:     Mouth: Mucous membranes are moist.     Pharynx: Oropharynx is clear.  Eyes:     Extraocular Movements: Extraocular movements intact.     Conjunctiva/sclera: Conjunctivae normal.     Pupils: Pupils are equal, round, and reactive to light.  Neck:     Vascular: No carotid bruit.  Cardiovascular:     Rate and Rhythm: Normal rate and regular rhythm.     Pulses: Normal pulses.     Heart sounds: Normal heart sounds.  Pulmonary:     Effort: Pulmonary effort is normal.     Breath sounds: Wheezing present.     Comments: Dry, nonproductive cough present during today's visit. Abdominal:     General: Bowel sounds are normal. There is no distension.     Palpations: Abdomen is soft. There is no mass.     Tenderness: There is no abdominal tenderness. There is no guarding or rebound.     Hernia: No hernia is present.  Musculoskeletal:        General: Normal range of motion.     Cervical back: Normal range of motion and neck supple.  Lymphadenopathy:     Cervical: No cervical adenopathy.  Skin:    General: Skin is warm and dry.     Capillary Refill: Capillary refill takes less than 2 seconds.  Neurological:     General: No focal deficit present.     Mental Status: She is alert and oriented to person, place, and time.  Psychiatric:        Attention and Perception: Attention and perception normal.        Mood and Affect: Affect normal. Mood is anxious.        Speech: Speech normal.        Behavior: Behavior normal. Behavior is cooperative.         Thought Content: Thought content normal.        Cognition and Memory: Cognition normal.        Judgment: Judgment normal.     Results for orders placed or performed in visit on 12/21/21  Hepatitis C antibody  Result Value Ref Range   Hep C Virus Ab Non Reactive Non Reactive  POCT UA - Microalbumin  Result Value Ref Range   Microalbumin Ur, POC 10mg /l mg/L   Creatinine, POC 50mg /dl mg/dL  Albumin/Creatinine Ratio, Urine, POC 30mg /g     Assessment & Plan    1. Encounter for general adult medical examination with abnormal findings Annual wellness visit today.  2. Essential hypertension Blood pressure stable.  Continue medications as prescribed. - hydrochlorothiazide (HYDRODIURIL) 25 MG tablet; Take 1 tablet (25 mg total) by mouth daily.  Dispense: 90 tablet; Refill: 1  3. Chronic obstructive pulmonary disease, unspecified COPD type (HCC) Trial of Breztri.  Advised patient to use 2 inhalations twice daily.  Use rescue inhaler as needed and as prescribed. - Budeson-Glycopyrrol-Formoterol (BREZTRI AEROSPHERE) 160-9-4.8 MCG/ACT AERO; Inhale 2 puffs into the lungs 2 (two) times daily.  Dispense: 10.7 g; Refill: 1  4. Diet-controlled type 2 diabetes mellitus (HCC) Reviewed labs, recent hemoglobin A1c 6.9.  Urine microalbumin normal today.  Continue to consume a low-cholesterol, low carbohydrate, low calorie diet.  Monitor blood sugars daily.  The goal is to have fasting blood sugars between 70 and 100.  Patient voiced understanding. - POCT UA - Microalbumin  5. Body mass index (BMI) of 36.0-36.9 in adult Discussed lowering calorie intake to 1500 calories per day and incorporating exercise into daily routine to help lose weight.   6. Meniere's disease, unspecified laterality Single prescription for lorazepam 1 mg tablet may be taken daily as needed.  - LORazepam (ATIVAN) 1 MG tablet; Take 1 tablet (1 mg total) by mouth daily as needed for anxiety.  Dispense: 30 tablet; Refill:  0  7. Ovarian failure Bone density test ordered today. - DG Bone Density; Future  8. Estrogen deficiency Bone density test ordered today. - DG Bone Density; Future  9. Thyroid nodule We will get repeat ultrasound of thyroid today.  Refer to endocrinology and/or ENT as indicated. - US THYROID; Future  10. Generalized anxiety disorder May take lorazepam 1 mg tablets as needed for anxiety.  Single prescription for number 30 tablets sent to pharmacy.  Patient does have upcoming appointment with psychiatry. - LORazepam (ATIVAN) 1 MG tablet; Take 1 tablet (1 mg total) by mouth daily as needed for anxiety.  Dispense: 30 tablet; Refill: 0  11. Need for hepatitis C screening test Hepatitis C screening performed during today's visit.  Negative results. - Hepatitis C antibody; Future - Hepatitis C antibody  12. Encounter for screening mammogram for malignant neoplasm of breast Screening mammogram ordered today.   - MM DIGITAL SCREENING BILATERAL; Future  13. Need for Tdap vaccination Tdap vaccine administered during today's visit. - Tdap vaccine greater than or equal to 7yo IM   Problem List Items Addressed This Visit       Cardiovascular and Mediastinum   Essential hypertension   Relevant Medications   hydrochlorothiazide (HYDRODIURIL) 25 MG tablet     Respiratory   Chronic obstructive pulmonary disease (HCC)   Relevant Medications   Budeson-Glycopyrrol-Formoterol (BREZTRI AEROSPHERE) 160-9-4.8 MCG/ACT AERO     Endocrine   Diet-controlled type 2 diabetes mellitus (HCC)   Relevant Orders   POCT UA - Microalbumin (Completed)   Thyroid nodule   Relevant Orders   US THYROID (Completed)     Nervous and Auditory   Meniere's disease   Relevant Medications   LORazepam (ATIVAN) 1 MG tablet     Other   Body mass index (BMI) of 36.0-36.9 in adult   Estrogen deficiency   Relevant Orders   DG Bone Density   Generalized anxiety disorder   Relevant Medications   LORazepam  (ATIVAN) 1 MG tablet   Other Visit Diagnoses  Encounter for general adult medical examination with abnormal findings    -  Primary   Need for hepatitis C screening test       Relevant Orders   Hepatitis C antibody (Completed)   Encounter for screening mammogram for malignant neoplasm of breast       Relevant Orders   MM DIGITAL SCREENING BILATERAL   Need for Tdap vaccination       Relevant Orders   Tdap vaccine greater than or equal to 7yo IM (Completed)        Return in about 6 weeks (around 02/01/2022) for copd - aded Breztri preventive .         Carlean Jews, NP  [facility] at [facility] [phone] (phone) [phone] (fax)  [facility]

## 2021-12-21 NOTE — Telephone Encounter (Signed)
Patient is aware 

## 2021-12-22 LAB — HEPATITIS C ANTIBODY: Hep C Virus Ab: NONREACTIVE

## 2021-12-27 ENCOUNTER — Ambulatory Visit
Admission: RE | Admit: 2021-12-27 | Discharge: 2021-12-27 | Disposition: A | Discharge status: Home / Self Care | Payer: Medicare Other | Source: Ambulatory Visit | Attending: Nurse Practitioner | Admitting: Nurse Practitioner

## 2021-12-27 DIAGNOSIS — E041 Nontoxic single thyroid nodule: Secondary | ICD-10-CM | POA: Diagnosis not present

## 2021-12-28 ENCOUNTER — Other Ambulatory Visit: Payer: Self-pay | Admitting: Nurse Practitioner

## 2021-12-28 NOTE — Progress Notes (Signed)
Other lab results are good.

## 2021-12-28 NOTE — Progress Notes (Signed)
Hey. Please let the patient know that right thyroid nodule is measuring 3.9 cm. I don't have a prior ultrasound to compare it to. I recommend that we do a referral to endocrinology for further evaluation. We can do this now, or we can wait until I see her back in April.  ?Thanks so much.   -HB

## 2021-12-30 DIAGNOSIS — H8109 Meniere's disease, unspecified ear: Secondary | ICD-10-CM | POA: Insufficient documentation

## 2021-12-30 DIAGNOSIS — E2839 Other primary ovarian failure: Secondary | ICD-10-CM | POA: Insufficient documentation

## 2021-12-30 DIAGNOSIS — F411 Generalized anxiety disorder: Secondary | ICD-10-CM | POA: Insufficient documentation

## 2021-12-30 NOTE — Patient Instructions (Signed)
Fat and Cholesterol Restricted Eating Plan Getting too much fat and cholesterol in your diet may cause health problems. Choosing the right foods helps keep your fat and cholesterol at normal levels. This can keep you from getting certain diseases. Your doctor may recommend an eating plan that includes: Total fat: ______% or less of total calories a day. This is ______g of fat a day. Saturated fat: ______% or less of total calories a day. This is ______g of saturated fat a day. Cholesterol: less than _________mg a day. Fiber: ______g a day. What are tips for following this plan? General tips Work with your doctor to lose weight if you need to. Avoid: Foods with added sugar. Fried foods. Foods with trans fat or partially hydrogenated oils. This includes some margarines and baked goods. If you drink alcohol: Limit how much you have to: 0-1 drink a day for women who are not pregnant. 0-2 drinks a day for men. Know how much alcohol is in a drink. In the [place] one drink equals one 12 oz bottle of beer (355 mL), one 5 oz glass of wine (148 mL), or one 1 oz glass of hard liquor (44 mL). Reading food labels Check food labels for: Trans fats. Partially hydrogenated oils. Saturated fat (g) in each serving. Cholesterol (mg) in each serving. Fiber (g) in each serving. Choose foods with healthy fats, such as: Monounsaturated fats and polyunsaturated fats. These include olive and canola oil, flaxseeds, walnuts, almonds, and seeds. Omega-3 fats. These are found in certain fish, flaxseed oil, and ground flaxseeds. Choose grain products that have whole grains. Look for the word "whole" as the first word in the ingredient list. Cooking Cook foods using low-fat methods. These include baking, boiling, grilling, and broiling. Eat more home-cooked foods. Eat at restaurants and buffets less often. Eat less fast food. Avoid cooking using saturated fats, such as butter, cream, palm oil, palm kernel oil, and  coconut oil. Meal planning  At meals, divide your plate into four equal parts: Fill one-half of your plate with vegetables, green salads, and fruit. Fill one-fourth of your plate with whole grains. Fill one-fourth of your plate with low-fat (lean) protein foods. Eat fish that is high in omega-3 fats at least two times a week. This includes mackerel, tuna, sardines, and salmon. Eat foods that are high in fiber, such as whole grains, beans, apples, pears, berries, broccoli, carrots, peas, and barley. What foods should I eat? Fruits All fresh, canned (in natural juice), or frozen fruits. Vegetables Fresh or frozen vegetables (raw, steamed, roasted, or grilled). Green salads. Grains Whole grains, such as whole wheat or whole grain breads, crackers, cereals, and pasta. Unsweetened oatmeal, bulgur, barley, quinoa, or brown rice. Corn or whole wheat flour tortillas. Meats and other protein foods Ground beef (85% or leaner), grass-fed beef, or beef trimmed of fat. Skinless chicken or turkey. Ground chicken or turkey. Pork trimmed of fat. All fish and seafood. Egg whites. Dried beans, peas, or lentils. Unsalted nuts or seeds. Unsalted canned beans. Nut butters without added sugar or oil. Dairy Low-fat or nonfat dairy products, such as skim or 1% milk, 2% or reduced-fat cheeses, low-fat and fat-free ricotta or cottage cheese, or plain low-fat and nonfat yogurt. Fats and oils Tub margarine without trans fats. Light or reduced-fat mayonnaise and salad dressings. Avocado. Olive, canola, sesame, or safflower oils. The items listed above may not be a complete list of foods and beverages you can eat. Contact a dietitian for more information. What foods   should I avoid? Fruits Canned fruit in heavy syrup. Fruit in cream or butter sauce. Fried fruit. Vegetables Vegetables cooked in cheese, cream, or butter sauce. Fried vegetables. Grains White bread. White pasta. White rice. Cornbread. Bagels, pastries,  and croissants. Crackers and snack foods that contain trans fat and hydrogenated oils. Meats and other protein foods Fatty cuts of meat. Ribs, chicken wings, bacon, sausage, bologna, salami, chitterlings, fatback, hot dogs, bratwurst, and packaged lunch meats. Liver and organ meats. Whole eggs and egg yolks. Chicken and turkey with skin. Fried meat. Dairy Whole or 2% milk, cream, half-and-half, and cream cheese. Whole milk cheeses. Whole-fat or sweetened yogurt. Full-fat cheeses. Nondairy creamers and whipped toppings. Processed cheese, cheese spreads, and cheese curds. Fats and oils Butter, stick margarine, lard, shortening, ghee, or bacon fat. Coconut, palm kernel, and palm oils. Beverages Alcohol. Sugar-sweetened drinks such as sodas, lemonade, and fruit drinks. Sweets and desserts Corn syrup, sugars, honey, and molasses. Candy. Jam and jelly. Syrup. Sweetened cereals. Cookies, pies, cakes, donuts, muffins, and ice cream. The items listed above may not be a complete list of foods and beverages you should avoid. Contact a dietitian for more information. Summary Choosing the right foods helps keep your fat and cholesterol at normal levels. This can keep you from getting certain diseases. At meals, fill one-half of your plate with vegetables, green salads, and fruits. Eat high fiber foods, like whole grains, beans, apples, pears, berries, carrots, peas, and barley. Limit added sugar, saturated fats, alcohol, and fried foods. This information is not intended to replace advice given to you by your health care provider. Make sure you discuss any questions you have with your health care provider. Document Revised: 02/10/2021 Document Reviewed: 02/10/2021 Elsevier Patient Education  2022 Elsevier Inc.  

## 2022-01-17 ENCOUNTER — Telehealth (HOSPITAL_BASED_OUTPATIENT_CLINIC_OR_DEPARTMENT_OTHER): Payer: Medicare Other | Admitting: Psychiatry

## 2022-01-17 ENCOUNTER — Encounter (HOSPITAL_COMMUNITY): Payer: Self-pay | Admitting: Psychiatry

## 2022-01-17 VITALS — Wt 211.0 lb

## 2022-01-17 DIAGNOSIS — H8109 Meniere's disease, unspecified ear: Secondary | ICD-10-CM | POA: Diagnosis not present

## 2022-01-17 DIAGNOSIS — F4310 Post-traumatic stress disorder, unspecified: Secondary | ICD-10-CM | POA: Diagnosis not present

## 2022-01-17 DIAGNOSIS — F418 Other specified anxiety disorders: Secondary | ICD-10-CM | POA: Diagnosis not present

## 2022-01-17 MED ORDER — LORAZEPAM 0.5 MG PO TABS: 0.5000 mg | ORAL_TABLET | Freq: Every day | ORAL | 0 refills | Status: DC | PRN

## 2022-01-17 MED ORDER — SERTRALINE HCL 100 MG PO TABS: 100.0000 mg | ORAL_TABLET | Freq: Every day | ORAL | 0 refills | Status: DC
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
## 2022-01-22 ENCOUNTER — Other Ambulatory Visit: Payer: Self-pay | Admitting: Nurse Practitioner

## 2022-01-22 DIAGNOSIS — E2839 Other primary ovarian failure: Secondary | ICD-10-CM

## 2022-01-31 NOTE — Progress Notes (Signed)
Established patient visit ? ? ?Patient: Angela Mcintyre   DOB: 06/10/1956   66 y.o. Female  MRN: [id number] ?Visit Date: 02/01/2022 ? ? ?Chief Complaint  ?Patient presents with  ? COPD  ? ?Subjective  ?  ?HPI  ?Routine follow up visit.  ?-HgbA1c 6.9 on recent labs. Patient has tried metformin in the past. Caused her to have stomach upset along with diarrhea.  ?-thyroid ultrasound in 12/2021 showed single nodule (TI-RADS 4), measuring 3.9 cm, located in the mid right thyroid lobe, meets criteria for FNA. It is nearly completely solid. She has had this nodule biopsied in the past, prior to moving to [place]. Biopsy showed no cancer and some fluid was able to be drained. Will refer to endocrinology ?-sister was recently diagnosed with lymphoma.  ?-scheduled for mammogram 02/01/2022. ?-bone density test scheduled for 06/2022.  ? ? ?Medications: ?Outpatient Medications Prior to Visit  ?Medication Sig  ? albuterol (PROVENTIL) (2.5 MG/3ML) 0.083% nebulizer solution Use 1 vial nebulized every 6 hours as needed for shortness of breath (Patient not taking: Reported on 12/21/2021)  ? Budeson-Glycopyrrol-Formoterol (BREZTRI AEROSPHERE) 160-9-4.8 MCG/ACT AERO Inhale 2 puffs into the lungs 2 (two) times daily.  ? cyclobenzaprine (FLEXERIL) 10 MG tablet Take 1 tablet (10 mg total) by mouth 2 (two) times daily as needed.  ? fluticasone (FLOVENT HFA) 110 MCG/ACT inhaler Inhale 1 puff into the lungs 4 (four) times daily.  ? hydrochlorothiazide (HYDRODIURIL) 25 MG tablet Take 1 tablet (25 mg total) by mouth daily.  ? LORazepam (ATIVAN) 0.5 MG tablet Take 1 tablet (0.5 mg total) by mouth daily as needed for anxiety.  ? metoprolol tartrate (LOPRESSOR) 25 MG tablet Take 25 mg by mouth 2 (two) times daily. (Patient not taking: Reported on 12/21/2021)  ? sertraline (ZOLOFT) 100 MG tablet Take 1 tablet (100 mg total) by mouth daily.  ? ?No facility-administered medications prior to visit.  ? ? ?Review of Systems  ?Constitutional:  Negative for  activity change, appetite change, chills, fatigue and fever.  ?HENT:  Negative for congestion, postnasal drip, rhinorrhea, sinus pressure, sinus pain, sneezing and sore throat.   ?Eyes: Negative.   ?Respiratory:  Negative for cough, chest tightness, shortness of breath and wheezing.   ?Cardiovascular:  Negative for chest pain and palpitations.  ?Gastrointestinal:  Negative for abdominal pain, constipation, diarrhea, nausea and vomiting.  ?Endocrine: Negative for cold intolerance, heat intolerance, polydipsia and polyuria.  ?Genitourinary:  Negative for dyspareunia, dysuria, flank pain, frequency and urgency.  ?Musculoskeletal:  Negative for arthralgias, back pain and myalgias.  ?Skin:  Negative for rash.  ?Allergic/Immunologic: Negative for environmental allergies.  ?Neurological:  Negative for dizziness, weakness and headaches.  ?Hematological:  Negative for adenopathy.  ?Psychiatric/Behavioral:  The patient is not nervous/anxious.   ? ? ? ? Objective  ?  ? ?Today's Vitals  ? 02/01/22 0919  ?BP: 118/77  ?Pulse: 60  ?Temp: 98.1 ?F (36.7 ?C)  ?SpO2: 96%  ?Weight: 217 lb 8 oz (98.7 kg)  ?Height: 5' 4.17" (1.63 m)  ? ?Body mass index is 37.13 kg/m?.  ? ?BP Readings from Last 3 Encounters:  ?02/01/22 118/77  ?12/21/21 110/74  ?09/18/21 128/81  ?  ?Wt Readings from Last 3 Encounters:  ?02/01/22 217 lb 8 oz (98.7 kg)  ?12/21/21 211 lb 1.6 oz (95.8 kg)  ?09/18/21 199 lb 12.8 oz (90.6 kg)  ?  ?Physical Exam ?Vitals and nursing note reviewed.  ?Constitutional:   ?   Appearance: Normal appearance. She is well-developed. She is obese.  ?  HENT:  ?   Head: Normocephalic and atraumatic.  ?   Nose: Nose normal.  ?   Mouth/Throat:  ?   Mouth: Mucous membranes are moist.  ?   Pharynx: Oropharynx is clear.  ?Eyes:  ?   Extraocular Movements: Extraocular movements intact.  ?   Conjunctiva/sclera: Conjunctivae normal.  ?   Pupils: Pupils are equal, round, and reactive to light.  ?Neck:  ?   Thyroid: Thyroid mass and thyromegaly present.   ?Cardiovascular:  ?   Rate and Rhythm: Normal rate and regular rhythm.  ?   Pulses: Normal pulses.  ?   Heart sounds: Normal heart sounds.  ?Pulmonary:  ?   Effort: Pulmonary effort is normal.  ?   Breath sounds: Normal breath sounds.  ?Abdominal:  ?   Palpations: Abdomen is soft.  ?Musculoskeletal:     ?   General: Normal range of motion.  ?   Cervical back: Normal range of motion and neck supple.  ?Lymphadenopathy:  ?   Cervical: No cervical adenopathy.  ?Skin: ?   General: Skin is warm and dry.  ?   Capillary Refill: Capillary refill takes less than 2 seconds.  ?Neurological:  ?   General: No focal deficit present.  ?   Mental Status: She is alert and oriented to person, place, and time.  ?Psychiatric:     ?   Mood and Affect: Mood normal.     ?   Behavior: Behavior normal.     ?   Thought Content: Thought content normal.     ?   Judgment: Judgment normal.  ?  ? ? Assessment & Plan  ?  ?1. Thyroid nodule ?Thyroid ultrasound showing 3.9 cm nodule on right thyroid lobe.  Historically, this has been benign.  Refer to endocrinology for further evaluation. ?- Ambulatory referral to Endocrinology ? ?2. Prediabetes ?Most recent hemoglobin A1c 6.9.  Patient wanting to try diet and exercise to lower blood sugar without starting new medication at this time. Discussed lowering calorie intake to 1500 calories per day and incorporating exercise into daily routine to help lose weight.  Recheck hemoglobin A1c and visit in 3 months. ? ?3. Body mass index (BMI) of 37.0-37.9 in adult ?Discussed lowering calorie intake to 1500 calories per day and incorporating exercise into daily routine to help lose weight.  ? ?4. Essential hypertension ?Stable.  Continue blood pressure medication as prescribed. ? ? ?Problem List Items Addressed This Visit   ? ?  ? Cardiovascular and Mediastinum  ? Essential hypertension  ?  ? Endocrine  ? Thyroid nodule - Primary  ? Relevant Orders  ? Ambulatory referral to Endocrinology  ?  ? Other  ? Body  mass index (BMI) of 37.0-37.9 in adult  ? Prediabetes  ?  ? ?Return in about 3 months (around 05/03/2022) for check HgbA1c.  ?   ? ? ? ? ?Carlean Jews, NP  ?[facility] ?[phone] (phone) ?[phone] (fax) ? ?[facility]  ?

## 2022-02-01 ENCOUNTER — Ambulatory Visit (INDEPENDENT_AMBULATORY_CARE_PROVIDER_SITE_OTHER): Payer: Medicare Other | Admitting: Nurse Practitioner

## 2022-02-01 ENCOUNTER — Encounter: Payer: Self-pay | Admitting: Nurse Practitioner

## 2022-02-01 ENCOUNTER — Ambulatory Visit: Payer: Medicare Other

## 2022-02-01 VITALS — BP 118/77 | HR 60 | Temp 98.1°F | Ht 64.17 in | Wt 217.5 lb

## 2022-02-01 DIAGNOSIS — E041 Nontoxic single thyroid nodule: Secondary | ICD-10-CM | POA: Diagnosis not present

## 2022-02-01 DIAGNOSIS — I10 Essential (primary) hypertension: Secondary | ICD-10-CM | POA: Diagnosis not present

## 2022-02-01 DIAGNOSIS — Z6837 Body mass index (BMI) 37.0-37.9, adult: Secondary | ICD-10-CM | POA: Diagnosis not present

## 2022-02-01 DIAGNOSIS — R7303 Prediabetes: Secondary | ICD-10-CM

## 2022-02-01 NOTE — Patient Instructions (Signed)
Fat and Cholesterol Restricted Eating Plan ?Getting too much fat and cholesterol in your diet may cause health problems. Choosing the right foods helps keep your fat and cholesterol at normal levels. This can keep you from getting certain diseases. ?Your doctor may recommend an eating plan that includes: ?Total fat: ______% or less of total calories a day. This is ______g of fat a day. ?Saturated fat: ______% or less of total calories a day. This is ______g of saturated fat a day. ?Cholesterol: less than _________mg a day. ?Fiber: ______g a day. ?What are tips for following this plan? ?General tips ?Work with your doctor to lose weight if you need to. ?Avoid: ?Foods with added sugar. ?Fried foods. ?Foods with trans fat or partially hydrogenated oils. This includes some margarines and baked goods. ?If you drink alcohol: ?Limit how much you have to: ?0-1 drink a day for women who are not pregnant. ?0-2 drinks a day for men. ?Know how much alcohol is in a drink. In the [place] one drink equals one 12 oz bottle of beer (355 mL), one 5 oz glass of wine (148 mL), or one 1? oz glass of hard liquor (44 mL). ?Reading food labels ?Check food labels for: ?Trans fats. ?Partially hydrogenated oils. ?Saturated fat (g) in each serving. ?Cholesterol (mg) in each serving. ?Fiber (g) in each serving. ?Choose foods with healthy fats, such as: ?Monounsaturated fats and polyunsaturated fats. These include olive and canola oil, flaxseeds, walnuts, almonds, and seeds. ?Omega-3 fats. These are found in certain fish, flaxseed oil, and ground flaxseeds. ?Choose grain products that have whole grains. Look for the word "whole" as the first word in the ingredient list. ?Cooking ?Cook foods using low-fat methods. These include baking, boiling, grilling, and broiling. ?Eat more home-cooked foods. Eat at restaurants and buffets less often. Eat less fast food. ?Avoid cooking using saturated fats, such as butter, cream, palm oil, palm kernel oil, and  coconut oil. ?Meal planning ? ?At meals, divide your plate into four equal parts: ?Fill one-half of your plate with vegetables, green salads, and fruit. ?Fill one-fourth of your plate with whole grains. ?Fill one-fourth of your plate with low-fat (lean) protein foods. ?Eat fish that is high in omega-3 fats at least two times a week. This includes mackerel, tuna, sardines, and salmon. ?Eat foods that are high in fiber, such as whole grains, beans, apples, pears, berries, broccoli, carrots, peas, and barley. ?What foods should I eat? ?Fruits ?All fresh, canned (in natural juice), or frozen fruits. ?Vegetables ?Fresh or frozen vegetables (raw, steamed, roasted, or grilled). Green salads. ?Grains ?Whole grains, such as whole wheat or whole grain breads, crackers, cereals, and pasta. Unsweetened oatmeal, bulgur, barley, quinoa, or brown rice. Corn or whole wheat flour tortillas. ?Meats and other protein foods ?Ground beef (85% or leaner), grass-fed beef, or beef trimmed of fat. Skinless chicken or turkey. Ground chicken or turkey. Pork trimmed of fat. All fish and seafood. Egg whites. Dried beans, peas, or lentils. Unsalted nuts or seeds. Unsalted canned beans. Nut butters without added sugar or oil. ?Dairy ?Low-fat or nonfat dairy products, such as skim or 1% milk, 2% or reduced-fat cheeses, low-fat and fat-free ricotta or cottage cheese, or plain low-fat and nonfat yogurt. ?Fats and oils ?Tub margarine without trans fats. Light or reduced-fat mayonnaise and salad dressings. Avocado. Olive, canola, sesame, or safflower oils. ?The items listed above may not be a complete list of foods and beverages you can eat. Contact a dietitian for more information. ?What foods   should I avoid? ?Fruits ?Canned fruit in heavy syrup. Fruit in cream or butter sauce. Fried fruit. ?Vegetables ?Vegetables cooked in cheese, cream, or butter sauce. Fried vegetables. ?Grains ?White bread. White pasta. White rice. Cornbread. Bagels, pastries,  and croissants. Crackers and snack foods that contain trans fat and hydrogenated oils. ?Meats and other protein foods ?Fatty cuts of meat. Ribs, chicken wings, bacon, sausage, bologna, salami, chitterlings, fatback, hot dogs, bratwurst, and packaged lunch meats. Liver and organ meats. Whole eggs and egg yolks. Chicken and turkey with skin. Fried meat. ?Dairy ?Whole or 2% milk, cream, half-and-half, and cream cheese. Whole milk cheeses. Whole-fat or sweetened yogurt. Full-fat cheeses. Nondairy creamers and whipped toppings. Processed cheese, cheese spreads, and cheese curds. ?Fats and oils ?Butter, stick margarine, lard, shortening, ghee, or bacon fat. Coconut, palm kernel, and palm oils. ?Beverages ?Alcohol. Sugar-sweetened drinks such as sodas, lemonade, and fruit drinks. ?Sweets and desserts ?Corn syrup, sugars, honey, and molasses. Candy. Jam and jelly. Syrup. Sweetened cereals. Cookies, pies, cakes, donuts, muffins, and ice cream. ?The items listed above may not be a complete list of foods and beverages you should avoid. Contact a dietitian for more information. ?Summary ?Choosing the right foods helps keep your fat and cholesterol at normal levels. This can keep you from getting certain diseases. ?At meals, fill one-half of your plate with vegetables, green salads, and fruits. ?Eat high fiber foods, like whole grains, beans, apples, pears, berries, carrots, peas, and barley. ?Limit added sugar, saturated fats, alcohol, and fried foods. ?This information is not intended to replace advice given to you by your health care provider. Make sure you discuss any questions you have with your health care provider. ?Document Revised: 02/10/2021 Document Reviewed: 02/10/2021 ?Elsevier Patient Education ? 2023 Elsevier Inc. ? ?

## 2022-02-07 ENCOUNTER — Telehealth (HOSPITAL_BASED_OUTPATIENT_CLINIC_OR_DEPARTMENT_OTHER): Payer: Medicare Other | Admitting: Psychiatry

## 2022-02-07 ENCOUNTER — Encounter (HOSPITAL_COMMUNITY): Payer: Self-pay | Admitting: Psychiatry

## 2022-02-07 DIAGNOSIS — F4310 Post-traumatic stress disorder, unspecified: Secondary | ICD-10-CM | POA: Diagnosis not present

## 2022-02-07 DIAGNOSIS — F418 Other specified anxiety disorders: Secondary | ICD-10-CM | POA: Diagnosis not present

## 2022-02-07 DIAGNOSIS — H8109 Meniere's disease, unspecified ear: Secondary | ICD-10-CM | POA: Diagnosis not present

## 2022-02-07 MED ORDER — SERTRALINE HCL 100 MG PO TABS: 100.0000 mg | ORAL_TABLET | Freq: Every day | ORAL | 1 refills | Status: DC

## 2022-02-07 MED ORDER — LORAZEPAM 0.5 MG PO TABS: 0.5000 mg | ORAL_TABLET | Freq: Every day | ORAL | 1 refills | Status: DC | PRN
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
## 2022-02-09 ENCOUNTER — Ambulatory Visit: Payer: Medicare Other

## 2022-02-21 ENCOUNTER — Other Ambulatory Visit: Payer: Self-pay | Admitting: Nurse Practitioner

## 2022-02-21 DIAGNOSIS — G8929 Other chronic pain: Secondary | ICD-10-CM

## 2022-02-22 ENCOUNTER — Ambulatory Visit
Admission: RE | Admit: 2022-02-22 | Discharge: 2022-02-22 | Disposition: A | Discharge status: Home / Self Care | Payer: Medicare Other | Source: Ambulatory Visit | Attending: Nurse Practitioner | Admitting: Nurse Practitioner

## 2022-02-22 DIAGNOSIS — Z1231 Encounter for screening mammogram for malignant neoplasm of breast: Secondary | ICD-10-CM

## 2022-02-24 ENCOUNTER — Telehealth: Payer: Medicare Other | Admitting: Nurse Practitioner

## 2022-02-24 DIAGNOSIS — K047 Periapical abscess without sinus: Secondary | ICD-10-CM

## 2022-02-24 MED ORDER — CHLORHEXIDINE GLUCONATE 0.12 % MT SOLN: 15.0000 mL | Freq: Two times a day (BID) | OROMUCOSAL | 0 refills | Status: DC

## 2022-02-24 MED ORDER — AMOXICILLIN 500 MG PO CAPS: 500.0000 mg | ORAL_CAPSULE | Freq: Three times a day (TID) | ORAL | 0 refills | Status: AC

## 2022-02-24 MED ORDER — IBUPROFEN 600 MG PO TABS: 600.0000 mg | ORAL_TABLET | Freq: Three times a day (TID) | ORAL | 0 refills | Status: DC | PRN

## 2022-02-24 NOTE — Progress Notes (Signed)
?Virtual Visit Consent  ? ?Angela Mcintyre, you are scheduled for a virtual visit with a [facility] provider today. Just as with appointments in the office, your consent must be obtained to participate. Your consent will be active for this visit and any virtual visit you may have with one of our providers in the next 365 days. If you have a MyChart account, a copy of this consent can be sent to you electronically. ? ?As this is a virtual visit, video technology does not allow for your provider to perform a traditional examination. This may limit your provider's ability to fully assess your condition. If your provider identifies any concerns that need to be evaluated in person or the need to arrange testing (such as labs, EKG, etc.), we will make arrangements to do so. Although advances in technology are sophisticated, we cannot ensure that it will always work on either your end or our end. If the connection with a video visit is poor, the visit may have to be switched to a telephone visit. With either a video or telephone visit, we are not always able to ensure that we have a secure connection. ? ?By engaging in this virtual visit, you consent to the provision of healthcare and authorize for your insurance to be billed (if applicable) for the services provided during this visit. Depending on your insurance coverage, you may receive a charge related to this service. ? ?I need to obtain your verbal consent now. Are you willing to proceed with your visit today? ANAT ZINK has provided verbal consent on 02/24/2022 for a virtual visit (video or telephone). Angela Pounds, NP ? ?Date: 02/24/2022 11:51 AM ? ?Virtual Visit via Video Note  ? ?IGildardo Mcintyre, connected with  Angela Mcintyre  ([id number], 09/22/1956) on 02/24/22 at 12:15 PM EDT by a video-enabled telemedicine application and verified that I am speaking with the correct person using two identifiers. ? ?Location: ?Patient: Virtual Visit Location Patient:  Home ?Provider: Virtual Visit Location Provider: Home Office ?  ?I discussed the limitations of evaluation and management by telemedicine and the availability of in person appointments. The patient expressed understanding and agreed to proceed.   ? ?History of Present Illness: ?Angela Mcintyre is a 66 y.o. who identifies as a female who was assigned female at birth, and is being seen today for dental infection. ? ?She has complaint of right upper molar pain, facial swelling, dental infection and poor dentition. Needs to see a dentist. Requesting medication for pain and infection today. Denies fever, N/V or any periorbital involvement.  ? ? ? ?Problems:  ?Patient Active Problem List  ? Diagnosis Date Noted  ? Prediabetes 02/01/2022  ? Estrogen deficiency 12/30/2021  ? Meniere's disease 12/30/2021  ? Generalized anxiety disorder 12/30/2021  ? Chronic right shoulder pain 09/18/2021  ? Essential hypertension 09/18/2021  ? Diet-controlled type 2 diabetes mellitus ([place]) 09/18/2021  ? Thyroid nodule 09/18/2021  ? Impacted cerumen of right ear 09/18/2021  ? Chronic obstructive pulmonary disease ([place]) 09/18/2021  ? Body mass index (BMI) of 37.0-37.9 in adult 09/18/2021  ?  ?Allergies:  ?Allergies  ?Allergen Reactions  ? Gabapentin Swelling  ? ?Medications:  ?Current Outpatient Medications:  ?  amoxicillin (AMOXIL) 500 MG capsule, Take 1 capsule (500 mg total) by mouth 3 (three) times daily for 10 days., Disp: 30 capsule, Rfl: 0 ?  chlorhexidine (PERIDEX) 0.12 % solution, Use as directed 15 mLs in the mouth or throat 2 (two) times  daily., Disp: 473 mL, Rfl: 0 ?  ibuprofen (ADVIL) 600 MG tablet, Take 1 tablet (600 mg total) by mouth every 8 (eight) hours as needed., Disp: 30 tablet, Rfl: 0 ?  albuterol (PROVENTIL) (2.5 MG/3ML) 0.083% nebulizer solution, Use 1 vial nebulized every 6 hours as needed for shortness of breath (Patient not taking: Reported on 12/21/2021), Disp: 75 mL, Rfl: 12 ?  Budeson-Glycopyrrol-Formoterol (BREZTRI  AEROSPHERE) 160-9-4.8 MCG/ACT AERO, Inhale 2 puffs into the lungs 2 (two) times daily., Disp: 10.7 g, Rfl: 1 ?  cyclobenzaprine (FLEXERIL) 10 MG tablet, TAKE 1 TABLET BY MOUTH 2 TIMES DAILY AS NEEDED., Disp: 45 tablet, Rfl: 0 ?  fluticasone (FLOVENT HFA) 110 MCG/ACT inhaler, Inhale 1 puff into the lungs 4 (four) times daily., Disp: 1 each, Rfl: 12 ?  hydrochlorothiazide (HYDRODIURIL) 25 MG tablet, Take 1 tablet (25 mg total) by mouth daily., Disp: 90 tablet, Rfl: 1 ?  LORazepam (ATIVAN) 0.5 MG tablet, Take 1 tablet (0.5 mg total) by mouth daily as needed for anxiety., Disp: 30 tablet, Rfl: 1 ?  metoprolol tartrate (LOPRESSOR) 25 MG tablet, Take 25 mg by mouth 2 (two) times daily. (Patient not taking: Reported on 12/21/2021), Disp: , Rfl:  ?  sertraline (ZOLOFT) 100 MG tablet, Take 1 tablet (100 mg total) by mouth daily., Disp: 30 tablet, Rfl: 1 ? ?Observations/Objective: ?Patient is well-developed, well-nourished in no acute distress.  ?Resting comfortably at home.  ?Head is normocephalic, atraumatic.  ?No labored breathing.  ?Speech is clear and coherent with logical content.  ?Patient is alert and oriented at baseline.  ? ? ?Assessment and Plan: ?1. Dental infection ?- chlorhexidine (PERIDEX) 0.12 % solution; Use as directed 15 mLs in the mouth or throat 2 (two) times daily.  Dispense: 473 mL; Refill: 0 ?- amoxicillin (AMOXIL) 500 MG capsule; Take 1 capsule (500 mg total) by mouth 3 (three) times daily for 10 days.  Dispense: 30 capsule; Refill: 0 ?- ibuprofen (ADVIL) 600 MG tablet; Take 1 tablet (600 mg total) by mouth every 8 (eight) hours as needed.  Dispense: 30 tablet; Refill: 0 ?Follow up with dentist as soon as possible. ? ?Follow Up Instructions: ?I discussed the assessment and treatment plan with the patient. The patient was provided an opportunity to ask questions and all were answered. The patient agreed with the plan and demonstrated an understanding of the instructions.  A copy of instructions were sent  to the patient via MyChart unless otherwise noted below.  ? ? ? ?The patient was advised to call back or seek an in-person evaluation if the symptoms worsen or if the condition fails to improve as anticipated. ? ?Time:  ?I spent 11 minutes with the patient via telehealth technology discussing the above problems/concerns.   ? ?Angela Pounds, NP  ?

## 2022-02-24 NOTE — Patient Instructions (Signed)
?Amalia Greenhouse, thank you for joining Claiborne Rigg, NP for today's virtual visit.  While this provider is not your primary care provider (PCP), if your PCP is located in our provider database this encounter information will be shared with them immediately following your visit. ? ?Consent: ?(Patient) Angela Mcintyre provided verbal consent for this virtual visit at the beginning of the encounter. ? ?Current Medications: ? ?Current Outpatient Medications:  ?  amoxicillin (AMOXIL) 500 MG capsule, Take 1 capsule (500 mg total) by mouth 3 (three) times daily for 10 days., Disp: 30 capsule, Rfl: 0 ?  chlorhexidine (PERIDEX) 0.12 % solution, Use as directed 15 mLs in the mouth or throat 2 (two) times daily., Disp: 473 mL, Rfl: 0 ?  ibuprofen (ADVIL) 600 MG tablet, Take 1 tablet (600 mg total) by mouth every 8 (eight) hours as needed., Disp: 30 tablet, Rfl: 0 ?  albuterol (PROVENTIL) (2.5 MG/3ML) 0.083% nebulizer solution, Use 1 vial nebulized every 6 hours as needed for shortness of breath (Patient not taking: Reported on 12/21/2021), Disp: 75 mL, Rfl: 12 ?  Budeson-Glycopyrrol-Formoterol (BREZTRI AEROSPHERE) 160-9-4.8 MCG/ACT AERO, Inhale 2 puffs into the lungs 2 (two) times daily., Disp: 10.7 g, Rfl: 1 ?  cyclobenzaprine (FLEXERIL) 10 MG tablet, TAKE 1 TABLET BY MOUTH 2 TIMES DAILY AS NEEDED., Disp: 45 tablet, Rfl: 0 ?  fluticasone (FLOVENT HFA) 110 MCG/ACT inhaler, Inhale 1 puff into the lungs 4 (four) times daily., Disp: 1 each, Rfl: 12 ?  hydrochlorothiazide (HYDRODIURIL) 25 MG tablet, Take 1 tablet (25 mg total) by mouth daily., Disp: 90 tablet, Rfl: 1 ?  LORazepam (ATIVAN) 0.5 MG tablet, Take 1 tablet (0.5 mg total) by mouth daily as needed for anxiety., Disp: 30 tablet, Rfl: 1 ?  metoprolol tartrate (LOPRESSOR) 25 MG tablet, Take 25 mg by mouth 2 (two) times daily. (Patient not taking: Reported on 12/21/2021), Disp: , Rfl:  ?  sertraline (ZOLOFT) 100 MG tablet, Take 1 tablet (100 mg total) by mouth daily., Disp: 30  tablet, Rfl: 1  ? ?Medications ordered in this encounter:  ?Meds ordered this encounter  ?Medications  ? chlorhexidine (PERIDEX) 0.12 % solution  ?  Sig: Use as directed 15 mLs in the mouth or throat 2 (two) times daily.  ?  Dispense:  473 mL  ?  Refill:  0  ?  Order Specific Question:   Supervising Provider  ?  Answer:   Eber Hong [[id number]]  ? amoxicillin (AMOXIL) 500 MG capsule  ?  Sig: Take 1 capsule (500 mg total) by mouth 3 (three) times daily for 10 days.  ?  Dispense:  30 capsule  ?  Refill:  0  ?  Order Specific Question:   Supervising Provider  ?  Answer:   Eber Hong [[id number]]  ? ibuprofen (ADVIL) 600 MG tablet  ?  Sig: Take 1 tablet (600 mg total) by mouth every 8 (eight) hours as needed.  ?  Dispense:  30 tablet  ?  Refill:  0  ?  Order Specific Question:   Supervising Provider  ?  Answer:   Eber Hong [[id number]]  ?  ? ?*If you need refills on other medications prior to your next appointment, please contact your pharmacy* ? ?Follow-Up: ?Call back or seek an in-person evaluation if the symptoms worsen or if the condition fails to improve as anticipated. ? ? ?If you have been instructed to have an in-person evaluation today at a local Urgent Care facility, please use the link  below. It will take you to a list of all of our available [facility], including address, phone number and hours of operation. Please do not delay care.  ?[facility] ? ?If you or a family member do not have a primary care provider, use the link below to schedule a visit and establish care. When you choose a [facility] primary care physician or advanced practice provider, you gain a long-term partner in health. ?Find a Primary Care Provider ? ?Learn more about [facility] in-office and virtual care options: ?[facility] - Get Care Now  ?

## 2022-02-27 ENCOUNTER — Telehealth: Payer: Self-pay | Admitting: Nurse Practitioner

## 2022-02-27 NOTE — Telephone Encounter (Signed)
Patient states she was told she needed to have a biopsy done on right side of neck however she has not heard anything and she's becoming very anxious. Please advise.  ?

## 2022-02-28 NOTE — Telephone Encounter (Signed)
Patient is schedule for June 05, 2022 ?

## 2022-02-28 NOTE — Telephone Encounter (Signed)
Can you pass this information along to the patient so she knows that she does have appointment scheduled? Thanks so much.   -HB

## 2022-03-01 NOTE — Telephone Encounter (Signed)
Called pt she is advised of her appt on Aug 22 for Endo

## 2022-03-14 NOTE — Progress Notes (Signed)
Additional imaging recommended. Patient to be contacted to schedule appointment.

## 2022-03-15 ENCOUNTER — Other Ambulatory Visit: Payer: Self-pay | Admitting: Nurse Practitioner

## 2022-03-15 ENCOUNTER — Ambulatory Visit (HOSPITAL_COMMUNITY): Payer: Medicare Other | Admitting: Licensed Clinical Social Worker

## 2022-03-15 DIAGNOSIS — R928 Other abnormal and inconclusive findings on diagnostic imaging of breast: Secondary | ICD-10-CM

## 2022-03-22 ENCOUNTER — Telehealth (HOSPITAL_BASED_OUTPATIENT_CLINIC_OR_DEPARTMENT_OTHER): Payer: Medicare Other | Admitting: Psychiatry

## 2022-03-22 ENCOUNTER — Encounter (HOSPITAL_COMMUNITY): Payer: Self-pay | Admitting: Psychiatry

## 2022-03-22 ENCOUNTER — Other Ambulatory Visit: Payer: Self-pay | Admitting: Nurse Practitioner

## 2022-03-22 DIAGNOSIS — G8929 Other chronic pain: Secondary | ICD-10-CM

## 2022-03-22 DIAGNOSIS — F4310 Post-traumatic stress disorder, unspecified: Secondary | ICD-10-CM

## 2022-03-22 DIAGNOSIS — H8109 Meniere's disease, unspecified ear: Secondary | ICD-10-CM | POA: Diagnosis not present

## 2022-03-22 DIAGNOSIS — F418 Other specified anxiety disorders: Secondary | ICD-10-CM | POA: Diagnosis not present

## 2022-03-22 MED ORDER — LORAZEPAM 0.5 MG PO TABS: 0.5000 mg | ORAL_TABLET | Freq: Every day | ORAL | 1 refills | Status: DC | PRN

## 2022-03-22 MED ORDER — SERTRALINE HCL 100 MG PO TABS: 100.0000 mg | ORAL_TABLET | Freq: Every day | ORAL | 1 refills | Status: DC
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
## 2022-03-27 NOTE — Progress Notes (Signed)
[place] after additional imaging.

## 2022-03-28 ENCOUNTER — Other Ambulatory Visit: Payer: Medicare Other

## 2022-04-02 ENCOUNTER — Other Ambulatory Visit: Payer: Medicare Other

## 2022-04-11 ENCOUNTER — Other Ambulatory Visit: Payer: Self-pay | Admitting: Nurse Practitioner

## 2022-04-11 DIAGNOSIS — G8929 Other chronic pain: Secondary | ICD-10-CM

## 2022-04-12 ENCOUNTER — Other Ambulatory Visit: Payer: Self-pay

## 2022-04-12 DIAGNOSIS — G8929 Other chronic pain: Secondary | ICD-10-CM

## 2022-04-12 MED ORDER — CYCLOBENZAPRINE HCL 10 MG PO TABS: ORAL_TABLET | ORAL | 0 refills | Status: DC

## 2022-04-30 ENCOUNTER — Telehealth (HOSPITAL_COMMUNITY): Payer: Self-pay

## 2022-05-02 NOTE — Progress Notes (Signed)
Established patient visit   Patient: Angela Mcintyre   DOB: 1955-10-31   66 y.o. Female  MRN: [id number] Visit Date: 05/03/2022   Chief Complaint  Patient presents with   Diabetes   Subjective    HPI  Routine follow up visit  Prediabetes - due to have check of HgbA1c which is 7.9 today. She has been on metformin in the past. This made her have severe abdominal cramping and diarrhea . -?has she seen endocrinology due to large thyroid nodule Patient is having trouble with asthma.  -has been having to use albuterol nebulized three to four times daily  -having a hard time exercising due to the heat which is making it hard for her to breathe.  -has not seen improvement in breathing with addition of Breztri. In fact, breathing has become worse since weather has become so hot and humid with [place] air quality. Did not have this type of air quality where she lived in [place]   Is seeing psychiatry. -started on sertraline which was increased to 100 mg daily.  --patient states that this has really helped her depression.    Medications: Outpatient Medications Prior to Visit  Medication Sig   chlorhexidine (PERIDEX) 0.12 % solution Use as directed 15 mLs in the mouth or throat 2 (two) times daily.   cyclobenzaprine (FLEXERIL) 10 MG tablet TAKE 1 TABLET BY MOUTH 2 TIMES DAILY AS NEEDED.   hydrochlorothiazide (HYDRODIURIL) 25 MG tablet Take 1 tablet (25 mg total) by mouth daily.   ibuprofen (ADVIL) 600 MG tablet Take 1 tablet (600 mg total) by mouth every 8 (eight) hours as needed.   LORazepam (ATIVAN) 0.5 MG tablet Take 1 tablet (0.5 mg total) by mouth daily as needed for anxiety.   sertraline (ZOLOFT) 100 MG tablet Take 1 tablet (100 mg total) by mouth daily.   [DISCONTINUED] Budeson-Glycopyrrol-Formoterol (BREZTRI AEROSPHERE) 160-9-4.8 MCG/ACT AERO Inhale 2 puffs into the lungs 2 (two) times daily.   [DISCONTINUED] fluticasone (FLOVENT HFA) 110 MCG/ACT inhaler Inhale 1 puff into the  lungs 4 (four) times daily.   metoprolol tartrate (LOPRESSOR) 25 MG tablet Take 25 mg by mouth 2 (two) times daily. (Patient not taking: Reported on 12/21/2021)   [DISCONTINUED] albuterol (PROVENTIL) (2.5 MG/3ML) 0.083% nebulizer solution Use 1 vial nebulized every 6 hours as needed for shortness of breath (Patient not taking: Reported on 12/21/2021)   No facility-administered medications prior to visit.    Review of Systems  Constitutional:  Positive for fatigue. Negative for activity change, appetite change, chills and fever.       Weight gain 11 pounds since her last visit   HENT:  Negative for congestion, postnasal drip, rhinorrhea, sinus pressure, sinus pain, sneezing and sore throat.   Eyes: Negative.   Respiratory:  Positive for shortness of breath and wheezing. Negative for cough and chest tightness.   Cardiovascular:  Negative for chest pain and palpitations.  Gastrointestinal:  Negative for abdominal pain, constipation, diarrhea, nausea and vomiting.  Endocrine: Negative for cold intolerance, heat intolerance, polydipsia and polyuria.       Elevated blood sugars   Genitourinary:  Negative for dyspareunia, dysuria, flank pain, frequency and urgency.  Musculoskeletal:  Negative for arthralgias, back pain and myalgias.  Skin:  Negative for rash.  Allergic/Immunologic: Positive for environmental allergies.  Neurological:  Negative for dizziness, weakness and headaches.  Hematological:  Negative for adenopathy.  Psychiatric/Behavioral:  Positive for dysphoric mood and sleep disturbance. The patient is nervous/anxious.  Improved symptoms since psychiatrist increased dose sertraline     Last CBC Lab Results  Component Value Date   WBC 9.6 12/14/2021   HGB 14.0 12/14/2021   HCT 41.9 12/14/2021   MCV 88 12/14/2021   MCH 29.5 12/14/2021   RDW 13.1 12/14/2021   PLT 242 76/19/5093   Last metabolic panel Lab Results  Component Value Date   GLUCOSE 128 (H) 12/14/2021   NA 145  (H) 12/14/2021   K 4.0 12/14/2021   CL 101 12/14/2021   CO2 24 12/14/2021   BUN 14 12/14/2021   CREATININE 0.74 12/14/2021   EGFR 89 12/14/2021   CALCIUM 9.1 12/14/2021   PROT 6.5 12/14/2021   ALBUMIN 4.1 12/14/2021   LABGLOB 2.4 12/14/2021   AGRATIO 1.7 12/14/2021   BILITOT 0.4 12/14/2021   ALKPHOS 99 12/14/2021   AST 18 12/14/2021   ALT 16 12/14/2021   Last lipids Lab Results  Component Value Date   CHOL 181 12/14/2021   HDL 44 12/14/2021   LDLCALC 107 (H) 12/14/2021   TRIG 173 (H) 12/14/2021   CHOLHDL 4.1 12/14/2021   Last hemoglobin A1c Lab Results  Component Value Date   HGBA1C 7.9 (A) 05/03/2022   Last thyroid functions Lab Results  Component Value Date   TSH 2.010 12/14/2021        Objective     Today's Vitals   05/03/22 0915  BP: 101/67  Pulse: 66  SpO2: 94%  Weight: 228 lb 1.9 oz (103.5 kg)  Height: 5' 4.17" (1.63 m)   Body mass index is 38.95 kg/m.   BP Readings from Last 3 Encounters:  05/03/22 101/67  02/01/22 118/77  12/21/21 110/74    Wt Readings from Last 3 Encounters:  05/03/22 228 lb 1.9 oz (103.5 kg)  02/01/22 217 lb 8 oz (98.7 kg)  12/21/21 211 lb 1.6 oz (95.8 kg)    Physical Exam Vitals and nursing note reviewed.  Constitutional:      Appearance: Normal appearance. She is well-developed. She is obese.  HENT:     Head: Normocephalic and atraumatic.  Eyes:     Pupils: Pupils are equal, round, and reactive to light.  Cardiovascular:     Rate and Rhythm: Normal rate and regular rhythm.     Pulses: Normal pulses.     Heart sounds: Normal heart sounds.  Pulmonary:     Effort: Pulmonary effort is normal.     Breath sounds: Wheezing present.     Comments: Congested, no productive cough intermittently during visit.  Abdominal:     Palpations: Abdomen is soft.  Musculoskeletal:        General: Normal range of motion.     Cervical back: Normal range of motion and neck supple.  Lymphadenopathy:     Cervical: No cervical  adenopathy.  Skin:    General: Skin is warm and dry.     Capillary Refill: Capillary refill takes less than 2 seconds.  Neurological:     General: No focal deficit present.     Mental Status: She is alert and oriented to person, place, and time.  Psychiatric:        Mood and Affect: Mood normal.        Behavior: Behavior normal.        Thought Content: Thought content normal.        Judgment: Judgment normal.      Results for orders placed or performed in visit on 05/03/22  POCT glycosylated hemoglobin (Hb A1C)  Result Value Ref Range   Hemoglobin A1C 7.9 (A) 4.0 - 5.6 %   HbA1c POC (<> result, manual entry)     HbA1c, POC (prediabetic range)     HbA1c, POC (controlled diabetic range)      Assessment & Plan    1. Type 2 diabetes mellitus without complication, without long-term current use of insulin (HCC) HgbA1c 7.9. initiate Ozempic 0.25 mg weekly. Advised her to limit intake of carbohydrates and sugar and increase water intake. Encouraged her to check blood sugars daily, prior to eating. The goal is to have blood sugar between 70 and 100. She will keep log of her blood sugars and bring log with her to next visit. Referral made today for diabetic eye exam and to podiatry for diabetic foot care . - Ambulatory referral to Podiatry - Ambulatory referral to Ophthalmology - POCT glycosylated hemoglobin (Hb A1C) - Semaglutide,0.25 or 0.5MG/DOS, (OZEMPIC, 0.25 OR 0.5 MG/DOSE,) 2 MG/3ML SOPN; Inject 0.25 mg into the skin once a week.  Dispense: 3 mL; Refill: 1  2. Chronic obstructive pulmonary disease, unspecified COPD type ([place]) Change Breztri to Trelegy ellipta. Reviewed instructions for proper use. She may continue to use rescue inhaler and nebulizer treatments as needed and as prescribed.  - albuterol (PROVENTIL) (2.5 MG/3ML) 0.083% nebulizer solution; Use 1 vial nebulized every 6 hours as needed for shortness of breath  Dispense: 75 mL; Refill: 12 - Fluticasone-Umeclidin-Vilant  (TRELEGY ELLIPTA) 100-62.5-25 MCG/ACT AEPB; Inhale 1 puff into the lungs daily.  Dispense: 1 each; Refill: 11  3. Hypertension associated with diabetes ([place]) Stable. Continue medications as prescribed   4. BMI 38.0-38.9,adult Discussed lowering calorie intake to 1500 calories per day and incorporating exercise into daily routine to help lose weight.   5. Generalized anxiety disorder Improving. Patient will continue regular visits with psychiatry    Problem List Items Addressed This Visit       Cardiovascular and Mediastinum   Hypertension associated with diabetes ([place])   Relevant Medications   Semaglutide,0.25 or 0.5MG/DOS, (OZEMPIC, 0.25 OR 0.5 MG/DOSE,) 2 MG/3ML SOPN     Respiratory   Chronic obstructive pulmonary disease (HCC)   Relevant Medications   albuterol (PROVENTIL) (2.5 MG/3ML) 0.083% nebulizer solution   Fluticasone-Umeclidin-Vilant (TRELEGY ELLIPTA) 100-62.5-25 MCG/ACT AEPB     Endocrine   Type 2 diabetes mellitus without complication, without long-term current use of insulin (HCC) - Primary   Relevant Medications   Semaglutide,0.25 or 0.5MG/DOS, (OZEMPIC, 0.25 OR 0.5 MG/DOSE,) 2 MG/3ML SOPN   Other Relevant Orders   Ambulatory referral to Podiatry   Ambulatory referral to Ophthalmology   POCT glycosylated hemoglobin (Hb A1C) (Completed)     Other   Body mass index (BMI) of 37.0-37.9 in adult   Generalized anxiety disorder     Return in about 6 weeks (around 06/14/2022) for dm2 - started ozempic. should bring log blood sugars .         Ronnell Freshwater, NP  [facility] [phone] (phone) [phone] (fax)  [place]

## 2022-05-03 ENCOUNTER — Encounter: Payer: Self-pay | Admitting: Nurse Practitioner

## 2022-05-03 ENCOUNTER — Telehealth (HOSPITAL_COMMUNITY): Payer: Medicare Other | Admitting: Psychiatry

## 2022-05-03 ENCOUNTER — Ambulatory Visit (INDEPENDENT_AMBULATORY_CARE_PROVIDER_SITE_OTHER): Payer: Medicare Other | Admitting: Nurse Practitioner

## 2022-05-03 ENCOUNTER — Ambulatory Visit (HOSPITAL_COMMUNITY): Payer: Medicare Other | Admitting: Psychiatry

## 2022-05-03 VITALS — BP 101/67 | HR 66 | Ht 64.17 in | Wt 228.1 lb

## 2022-05-03 DIAGNOSIS — E1159 Type 2 diabetes mellitus with other circulatory complications: Secondary | ICD-10-CM | POA: Diagnosis not present

## 2022-05-03 DIAGNOSIS — E119 Type 2 diabetes mellitus without complications: Secondary | ICD-10-CM | POA: Diagnosis not present

## 2022-05-03 DIAGNOSIS — Z6838 Body mass index (BMI) 38.0-38.9, adult: Secondary | ICD-10-CM

## 2022-05-03 DIAGNOSIS — F411 Generalized anxiety disorder: Secondary | ICD-10-CM

## 2022-05-03 DIAGNOSIS — J449 Chronic obstructive pulmonary disease, unspecified: Secondary | ICD-10-CM

## 2022-05-03 DIAGNOSIS — I152 Hypertension secondary to endocrine disorders: Secondary | ICD-10-CM | POA: Diagnosis not present

## 2022-05-03 LAB — POCT GLYCOSYLATED HEMOGLOBIN (HGB A1C): Hemoglobin A1C: 7.9 % — AB (ref 4.0–5.6)

## 2022-05-03 MED ORDER — OZEMPIC (0.25 OR 0.5 MG/DOSE) 2 MG/3ML ~~LOC~~ SOPN: 0.2500 mg | PEN_INJECTOR | SUBCUTANEOUS | 1 refills | Status: DC

## 2022-05-03 MED ORDER — TRELEGY ELLIPTA 100-62.5-25 MCG/ACT IN AEPB: 1.0000 | INHALATION_SPRAY | Freq: Every day | RESPIRATORY_TRACT | 11 refills | Status: DC

## 2022-05-03 MED ORDER — ALBUTEROL SULFATE (2.5 MG/3ML) 0.083% IN NEBU: INHALATION_SOLUTION | RESPIRATORY_TRACT | 12 refills | Status: AC

## 2022-05-03 NOTE — Progress Notes (Signed)
New start ozempic. Failed prior tries with metformin

## 2022-05-09 NOTE — Telephone Encounter (Signed)
Error

## 2022-05-14 ENCOUNTER — Other Ambulatory Visit: Payer: Self-pay | Admitting: Nurse Practitioner

## 2022-05-14 DIAGNOSIS — G8929 Other chronic pain: Secondary | ICD-10-CM

## 2022-05-15 ENCOUNTER — Encounter: Payer: Self-pay | Admitting: Podiatry

## 2022-05-15 ENCOUNTER — Ambulatory Visit (INDEPENDENT_AMBULATORY_CARE_PROVIDER_SITE_OTHER): Payer: Medicare Other | Admitting: Podiatry

## 2022-05-15 DIAGNOSIS — E119 Type 2 diabetes mellitus without complications: Secondary | ICD-10-CM | POA: Diagnosis not present

## 2022-05-15 NOTE — Progress Notes (Signed)
  Subjective:  Patient ID: Angela Mcintyre, female    DOB: 07-21-1956,   MRN: [id number]  No chief complaint on file.   66 y.o. female presents for diabetic foot exam Denies burning and tingling in their feet. Relates feet do well just wanted to have them checked. Patient is diabetic and last A1c was  Lab Results  Component Value Date   HGBA1C 7.9 (A) 05/03/2022   .   PCP:  Carlean Jews, NP    . Denies any other pedal complaints. Denies n/v/f/c.   Past Medical History:  Diagnosis Date   High blood pressure     Objective:  Physical Exam: Vascular: DP/PT pulses 2/4 bilateral. CFT <3 seconds. Absent hair growth on digits. No edema noted to bilateral lower extremities. Xerosis noted bilaterally.  Skin. No lacerations or abrasions bilateral feet. Nails 1-5 bilateral  are normal in appearance. Musculoskeletal: MMT 5/5 bilateral lower extremities in DF, PF, Inversion and Eversion. Deceased ROM in DF of ankle joint.  Neurological: Sensation intact to light touch. Protective sensation intact bilateral.     Assessment:   1. Type 2 diabetes mellitus without complication, without long-term current use of insulin (HCC)      Plan:  Patient was evaluated and treated and all questions answered. -Discussed and educated patient on diabetic foot care, especially with  regards to the vascular, neurological and musculoskeletal systems.  -Stressed the importance of good glycemic control and the detriment of not  controlling glucose levels in relation to the foot. -Discussed supportive shoes at all times and checking feet regularly.  -Will return in 1 year for diabetic foot check.  -Patient advised to call the office if any problems or questions arise in the meantime.   Louann Sjogren, DPM

## 2022-06-04 NOTE — Progress Notes (Unsigned)
Name: Angela Mcintyre  MRN/ DOB: [id number], 09/30/1956   Age/ Sex: 66 y.o., female    PCP: Ronnell Freshwater, NP   Reason for Endocrinology Evaluation: Type {NUMBERS 1 OR 2:522190} Diabetes Mellitus     Date of Initial Endocrinology Visit: 06/04/2022     PATIENT IDENTIFIER: Angela Mcintyre is a 66 y.o. female with a past medical history of DM, Right thyroid nodules, HTN and COPD . The patient presented for initial endocrinology clinic visit on 06/04/2022 for consultative assistance with her diabetes management.    HPI: Ms. Auguste was    Diagnosed with DM *** Prior Medications tried/Intolerance: *** Currently checking blood sugars *** x / day,  before breakfast and ***.  Hypoglycemia episodes : ***               Symptoms: ***                 Frequency: ***/  Hemoglobin A1c has ranged from 6.9% in 12/2021, peaking at 7.9% in 04/2022. Patient required assistance for hypoglycemia:  Patient has required hospitalization within the last 1 year from hyper or hypoglycemia:   In terms of diet, the patient ***  THYROID HISTORY: Patient was noted without right thyroid nodule on physical exam which prompted thyroid ultrasound showing a light 3.9 cm nodule meeting FNA criteria on 12/27/2021      HOME DIABETES REGIMEN: Ozempic 0.5 mg weekly    Statin: No ACE-I/ARB: No   METER DOWNLOAD SUMMARY: Date range evaluated: *** Fingerstick Blood Glucose Tests = *** Average Number Tests/Day = *** Overall Mean FS Glucose = *** Standard Deviation = ***  BG Ranges: Low = *** High = ***   Hypoglycemic Events/30 Days: BG < 50 = *** Episodes of symptomatic severe hypoglycemia = ***   DIABETIC COMPLICATIONS: Microvascular complications:  *** Denies: *** Last eye exam: Completed   Macrovascular complications:   Denies: CAD, PVD, CVA   PAST HISTORY: Past Medical History:  Past Medical History:  Diagnosis Date   High blood pressure    Past Surgical History:  Past Surgical  History:  Procedure Laterality Date   CESAREAN SECTION  1973   CESAREAN SECTION      Social History:  reports that she has quit smoking. Her smoking use included cigarettes. She has never used smokeless tobacco. She reports that she does not currently use alcohol. She reports that she does not currently use drugs. Family History:  Family History  Problem Relation Age of Onset   Breast cancer Neg Hx      HOME MEDICATIONS: Allergies as of 06/05/2022       Reactions   Gabapentin Swelling        Medication List        Accurate as of June 04, 2022 11:00 AM. If you have any questions, ask your nurse or doctor.          albuterol (2.5 MG/3ML) 0.083% nebulizer solution Commonly known as: PROVENTIL Use 1 vial nebulized every 6 hours as needed for shortness of breath   chlorhexidine 0.12 % solution Commonly known as: Peridex Use as directed 15 mLs in the mouth or throat 2 (two) times daily.   cyclobenzaprine 10 MG tablet Commonly known as: FLEXERIL TAKE 1 TABLET BY MOUTH 2 TIMES DAILY AS NEEDED.   hydrochlorothiazide 25 MG tablet Commonly known as: HYDRODIURIL Take 1 tablet (25 mg total) by mouth daily.   ibuprofen 600 MG tablet Commonly known as: ADVIL Take 1 tablet (  600 mg total) by mouth every 8 (eight) hours as needed.   LORazepam 0.5 MG tablet Commonly known as: ATIVAN Take 1 tablet (0.5 mg total) by mouth daily as needed for anxiety.   metoprolol tartrate 25 MG tablet Commonly known as: LOPRESSOR Take 25 mg by mouth 2 (two) times daily.   Ozempic (0.25 or 0.5 MG/DOSE) 2 MG/3ML Sopn Generic drug: Semaglutide(0.25 or 0.5MG /DOS) Inject 0.25 mg into the skin once a week.   sertraline 100 MG tablet Commonly known as: ZOLOFT Take 1 tablet (100 mg total) by mouth daily.   Trelegy Ellipta 100-62.5-25 MCG/ACT Aepb Generic drug: Fluticasone-Umeclidin-Vilant Inhale 1 puff into the lungs daily.         ALLERGIES: Allergies  Allergen Reactions    Gabapentin Swelling     REVIEW OF SYSTEMS: A comprehensive ROS was conducted with the patient and is negative except as per HPI and below:  ROS    OBJECTIVE:   VITAL SIGNS: There were no vitals taken for this visit.   PHYSICAL EXAM:  General: Pt appears well and is in NAD  Hydration: Well-hydrated with moist mucous membranes and good skin turgor  HEENT: Head: Unremarkable with good dentition. Oropharynx clear without exudate.  Eyes: External eye exam normal without stare, lid lag or exophthalmos.  EOM intact.  PERRL.  Neck: General: Supple without adenopathy or carotid bruits. Thyroid: Thyroid size normal.  No goiter or nodules appreciated. No thyroid bruit.  Lungs: Clear with good BS bilat with no rales, rhonchi, or wheezes  Heart: RRR with normal S1 and S2 and no gallops; no murmurs; no rub  Abdomen: Normoactive bowel sounds, soft, nontender, without masses or organomegaly palpable  Extremities:  Lower extremities - No pretibial edema. No lesions.  Skin: Normal texture and temperature to palpation. No rash noted. No Acanthosis nigricans/skin tags. No lipohypertrophy.  Neuro: MS is good with appropriate affect, pt is alert and Ox3    DM foot exam:    DATA REVIEWED:  Lab Results  Component Value Date   HGBA1C 7.9 (A) 05/03/2022   HGBA1C 6.9 (H) 12/14/2021   Lab Results  Component Value Date   MICROALBUR 10mg /l 12/21/2021   LDLCALC 107 (H) 12/14/2021   CREATININE 0.74 12/14/2021   Lab Results  Component Value Date   MICRALBCREAT 30mg /g 12/21/2021    Lab Results  Component Value Date   CHOL 181 12/14/2021   HDL 44 12/14/2021   LDLCALC 107 (H) 12/14/2021   TRIG 173 (H) 12/14/2021   CHOLHDL 4.1 12/14/2021       Thyroid ultrasound 12/27/2021  Nodule # 1:   Location: Right; mid   Maximum size: 3.9 cm; Other 2 dimensions: 2.8 x 2.6 cm   Composition: solid/almost completely solid (2)   Echogenicity: hypoechoic (2)   Shape: not taller-than-wide (0)    Margins: smooth (0)   Echogenic foci: macrocalcifications (1)   ACR TI-RADS total points: 5.   ACR TI-RADS risk category: TR4 (4-6 points).   ACR TI-RADS recommendations:   **Given size (>/= 1.5 cm) and appearance, fine needle aspiration of this moderately suspicious nodule should be considered based on TI-RADS criteria.   _________________________________________________________   IMPRESSION: Nodule 1 (TI-RADS 4), measuring 3.9 cm, located in the mid right thyroid lobe, meets criteria for FNA.    ASSESSMENT / PLAN / RECOMMENDATIONS:   1) Type 2 Diabetes Mellitus, ***controlled, With*** complications - Most recent A1c of *** %. Goal A1c < 7.0 %.    Plan: GENERAL: ***  MEDICATIONS: ***  EDUCATION / INSTRUCTIONS: BG monitoring instructions: Patient is instructed to check her blood sugars *** times a day, ***. Call [facility] if: BG persistently < 70  I reviewed the Rule of 15 for the treatment of hypoglycemia in detail with the patient. Literature supplied.   2) Diabetic complications:  Eye: Does *** have known diabetic retinopathy.  Neuro/ Feet: Does *** have known diabetic peripheral neuropathy. Renal: Patient does *** have known baseline CKD. She is *** on an ACEI/ARB at present.   3) Right Thyroid Nodule :  - Pt with right thyroid nodule 3.9 cm meeting FNA criteria   Signed electronically by: Lyndle Herrlich, MD  [facility] Endocrinology  [facility] [address] [place], [place] [address] Phone: [phone] FAX: [phone]   CC: Carlean Jews, NP [address] [place] [address] Phone: [phone]  Fax: [phone]    Return to Endocrinology clinic as below: Future Appointments  Date Time Provider Department Center  06/05/2022  8:30 AM Alainah Phang, Konrad Dolores, MD [facility] None  06/14/2022  9:10 AM Carlean Jews, NP [facility] None  07/13/2022  2:30 PM [facility] DX DEXA  1 [facility]

## 2022-06-05 ENCOUNTER — Ambulatory Visit (INDEPENDENT_AMBULATORY_CARE_PROVIDER_SITE_OTHER): Payer: Medicare Other | Admitting: Internal Medicine

## 2022-06-05 ENCOUNTER — Encounter: Payer: Self-pay | Admitting: Internal Medicine

## 2022-06-05 VITALS — BP 122/80 | HR 60 | Ht 64.17 in | Wt 225.0 lb

## 2022-06-05 DIAGNOSIS — E1165 Type 2 diabetes mellitus with hyperglycemia: Secondary | ICD-10-CM | POA: Diagnosis not present

## 2022-06-05 DIAGNOSIS — E041 Nontoxic single thyroid nodule: Secondary | ICD-10-CM

## 2022-06-05 DIAGNOSIS — E119 Type 2 diabetes mellitus without complications: Secondary | ICD-10-CM

## 2022-06-05 LAB — POCT GLUCOSE (DEVICE FOR HOME USE): Glucose Fasting, POC: 157 mg/dL — AB (ref 70–99)

## 2022-06-05 MED ORDER — OZEMPIC (0.25 OR 0.5 MG/DOSE) 2 MG/3ML ~~LOC~~ SOPN: 0.5000 mg | PEN_INJECTOR | SUBCUTANEOUS | 3 refills | Status: DC

## 2022-06-05 NOTE — Patient Instructions (Addendum)
Increase Ozempic 0.5 mg weekly    HOW TO TREAT LOW BLOOD SUGARS (Blood sugar LESS THAN 70 MG/DL) Please follow the RULE OF 15 for the treatment of hypoglycemia treatment (when your (blood sugars are less than 70 mg/dL)   STEP 1: Take 15 grams of carbohydrates when your blood sugar is low, which includes:  3-4 GLUCOSE TABS  OR 3-4 OZ OF JUICE OR REGULAR SODA OR ONE TUBE OF GLUCOSE GEL    STEP 2: RECHECK blood sugar in 15 MINUTES STEP 3: If your blood sugar is still low at the 15 minute recheck --> then, go back to STEP 1 and treat AGAIN with another 15 grams of carbohydrates.

## 2022-06-12 ENCOUNTER — Other Ambulatory Visit (HOSPITAL_COMMUNITY): Payer: Self-pay | Admitting: Psychiatry

## 2022-06-12 ENCOUNTER — Other Ambulatory Visit: Payer: Self-pay | Admitting: Nurse Practitioner

## 2022-06-12 DIAGNOSIS — I10 Essential (primary) hypertension: Secondary | ICD-10-CM

## 2022-06-12 DIAGNOSIS — F4310 Post-traumatic stress disorder, unspecified: Secondary | ICD-10-CM

## 2022-06-12 DIAGNOSIS — F418 Other specified anxiety disorders: Secondary | ICD-10-CM

## 2022-06-12 DIAGNOSIS — G8929 Other chronic pain: Secondary | ICD-10-CM

## 2022-06-13 ENCOUNTER — Other Ambulatory Visit (HOSPITAL_COMMUNITY): Payer: Self-pay | Admitting: Psychiatry

## 2022-06-13 DIAGNOSIS — H8109 Meniere's disease, unspecified ear: Secondary | ICD-10-CM

## 2022-06-14 ENCOUNTER — Ambulatory Visit (INDEPENDENT_AMBULATORY_CARE_PROVIDER_SITE_OTHER): Payer: Medicare Other | Admitting: Nurse Practitioner

## 2022-06-14 ENCOUNTER — Encounter: Payer: Self-pay | Admitting: Nurse Practitioner

## 2022-06-14 VITALS — BP 109/73 | HR 69 | Ht 64.17 in | Wt 222.1 lb

## 2022-06-14 DIAGNOSIS — E119 Type 2 diabetes mellitus without complications: Secondary | ICD-10-CM | POA: Diagnosis not present

## 2022-06-14 DIAGNOSIS — I152 Hypertension secondary to endocrine disorders: Secondary | ICD-10-CM | POA: Diagnosis not present

## 2022-06-14 DIAGNOSIS — F411 Generalized anxiety disorder: Secondary | ICD-10-CM | POA: Diagnosis not present

## 2022-06-14 DIAGNOSIS — E1159 Type 2 diabetes mellitus with other circulatory complications: Secondary | ICD-10-CM

## 2022-06-14 NOTE — Progress Notes (Signed)
Established patient visit   Patient: Angela Mcintyre   DOB: 1956-06-24   66 y.o. Female  MRN: [id number] Visit Date: 06/14/2022   Chief Complaint  Patient presents with   Follow-up   Subjective    HPI  Follow up  -started ozempic due to type 2 diabetes  -most recent HgbA1c 7.9  -blood sugar log  -saw endocrinologist 06/05/2022  --blood sugar was 157 - she states that endocrinologist increased dose of ozempic to 0.5 mg weekly.  --blood sugar log shows that blood sugars are generally running in mid 100s.  --she is decreasing her intake of sweets and carbohydrates. She is drinking lots of water.  --she has lost 6 pounds since she was last seen in this office 05/03/2022. --she reports some shortness f breath in the hot and humid weather.  -she states that she is doing well otherwise.    Medications: Outpatient Medications Prior to Visit  Medication Sig   albuterol (PROVENTIL) (2.5 MG/3ML) 0.083% nebulizer solution Use 1 vial nebulized every 6 hours as needed for shortness of breath   chlorhexidine (PERIDEX) 0.12 % solution Use as directed 15 mLs in the mouth or throat 2 (two) times daily.   cyclobenzaprine (FLEXERIL) 10 MG tablet TAKE 1 TABLET BY MOUTH 2 TIMES DAILY AS NEEDED.   Fluticasone-Umeclidin-Vilant (TRELEGY ELLIPTA) 100-62.5-25 MCG/ACT AEPB Inhale 1 puff into the lungs daily.   hydrochlorothiazide (HYDRODIURIL) 25 MG tablet TAKE 1 TABLET (25 MG TOTAL) BY MOUTH DAILY.   ibuprofen (ADVIL) 600 MG tablet Take 1 tablet (600 mg total) by mouth every 8 (eight) hours as needed.   LORazepam (ATIVAN) 0.5 MG tablet Take 1 tablet (0.5 mg total) by mouth daily as needed for anxiety.   metoprolol tartrate (LOPRESSOR) 25 MG tablet Take 25 mg by mouth 2 (two) times daily.   Semaglutide,0.25 or 0.5MG /DOS, (OZEMPIC, 0.25 OR 0.5 MG/DOSE,) 2 MG/3ML SOPN Inject 0.5 mg into the skin once a week.   sertraline (ZOLOFT) 100 MG tablet Take 1 tablet (100 mg total) by mouth daily.   No  facility-administered medications prior to visit.    Review of Systems  Constitutional:  Negative for activity change, appetite change, chills, fatigue and fever.       Six pound weight loss since last visit   HENT:  Negative for congestion, postnasal drip, rhinorrhea, sinus pressure, sinus pain, sneezing and sore throat.   Eyes: Negative.   Respiratory:  Negative for cough, chest tightness, shortness of breath and wheezing.   Cardiovascular:  Negative for chest pain and palpitations.  Gastrointestinal:  Negative for abdominal pain, constipation, diarrhea, nausea and vomiting.  Endocrine: Negative for cold intolerance, heat intolerance, polydipsia and polyuria.       Improving blood sugars   Genitourinary:  Negative for dyspareunia, dysuria, flank pain, frequency and urgency.  Musculoskeletal:  Negative for arthralgias, back pain and myalgias.  Skin:  Negative for rash.  Allergic/Immunologic: Negative for environmental allergies.  Neurological:  Negative for dizziness, weakness and headaches.  Hematological:  Negative for adenopathy.  Psychiatric/Behavioral:  The patient is not nervous/anxious.        Objective     Today's Vitals   06/14/22 0914  BP: 109/73  Pulse: 69  SpO2: 94%  Weight: 222 lb 1.9 oz (100.8 kg)  Height: 5' 4.17" (1.63 m)   Body mass index is 37.93 kg/m.   BP Readings from Last 3 Encounters:  06/14/22 109/73  06/05/22 122/80  05/03/22 101/67    Wt Readings from Last 3  Encounters:  06/14/22 222 lb 1.9 oz (100.8 kg)  06/05/22 225 lb (102.1 kg)  05/03/22 228 lb 1.9 oz (103.5 kg)    Physical Exam Vitals and nursing note reviewed.  Constitutional:      Appearance: Normal appearance. She is well-developed. She is obese.  HENT:     Head: Normocephalic and atraumatic.  Eyes:     Pupils: Pupils are equal, round, and reactive to light.  Cardiovascular:     Rate and Rhythm: Normal rate and regular rhythm.     Pulses: Normal pulses.     Heart sounds:  Normal heart sounds.  Pulmonary:     Effort: Pulmonary effort is normal.     Breath sounds: Normal breath sounds.  Abdominal:     Palpations: Abdomen is soft.  Musculoskeletal:        General: Normal range of motion.     Cervical back: Normal range of motion and neck supple.  Lymphadenopathy:     Cervical: No cervical adenopathy.  Skin:    General: Skin is warm and dry.     Capillary Refill: Capillary refill takes less than 2 seconds.  Neurological:     General: No focal deficit present.     Mental Status: She is alert and oriented to person, place, and time.  Psychiatric:        Mood and Affect: Mood normal.        Behavior: Behavior normal.        Thought Content: Thought content normal.        Judgment: Judgment normal.      Assessment & Plan    1. Type 2 diabetes mellitus without complication, without long-term current use of insulin [facility]) Endocrinology recently increased her Ozempic shot to 0.5 mg weekly. She will continue to avoid sweets and drink plenty of water. She keeps blood sugar log and understands that the eventual goal is to have fasting blood sugars between 70 and 100.   2. Hypertension associated with diabetes (HCC) Stable. Continue bp medication as prescribed   3. Generalized anxiety disorder Continue regular visits with psychiatry.    Problem List Items Addressed This Visit       Cardiovascular and Mediastinum   Hypertension associated with diabetes [facility])     Endocrine   Type 2 diabetes mellitus without complication, without long-term current use of insulin (HCC) - Primary     Other   Generalized anxiety disorder     Return in about 3 months (around 09/13/2022) for diabetes with HgbA1c check.         Carlean Jews, NP  [facility] at [facility] [phone] (phone) [phone] (fax)  [facility]

## 2022-06-19 ENCOUNTER — Encounter (HOSPITAL_COMMUNITY): Payer: Self-pay | Admitting: Psychiatry

## 2022-06-19 ENCOUNTER — Telehealth (HOSPITAL_BASED_OUTPATIENT_CLINIC_OR_DEPARTMENT_OTHER): Payer: Medicare Other | Admitting: Psychiatry

## 2022-06-19 DIAGNOSIS — F418 Other specified anxiety disorders: Secondary | ICD-10-CM | POA: Diagnosis not present

## 2022-06-19 DIAGNOSIS — H8109 Meniere's disease, unspecified ear: Secondary | ICD-10-CM | POA: Diagnosis not present

## 2022-06-19 DIAGNOSIS — F4310 Post-traumatic stress disorder, unspecified: Secondary | ICD-10-CM

## 2022-06-19 MED ORDER — SERTRALINE HCL 100 MG PO TABS: 100.0000 mg | ORAL_TABLET | Freq: Every day | ORAL | 2 refills | Status: DC

## 2022-06-19 MED ORDER — LORAZEPAM 0.5 MG PO TABS: 0.5000 mg | ORAL_TABLET | Freq: Every day | ORAL | 2 refills | Status: DC | PRN
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
## 2022-06-27 ENCOUNTER — Other Ambulatory Visit: Payer: Medicare Other

## 2022-07-12 ENCOUNTER — Other Ambulatory Visit (HOSPITAL_COMMUNITY)
Admission: RE | Admit: 2022-07-12 | Discharge: 2022-07-12 | Disposition: A | Discharge status: Home / Self Care | Payer: Medicare Other | Source: Ambulatory Visit | Attending: Physician Assistant | Admitting: Physician Assistant

## 2022-07-12 ENCOUNTER — Ambulatory Visit
Admission: RE | Admit: 2022-07-12 | Discharge: 2022-07-12 | Disposition: A | Discharge status: Home / Self Care | Payer: Medicare Other | Source: Ambulatory Visit | Attending: Internal Medicine | Admitting: Internal Medicine

## 2022-07-12 DIAGNOSIS — E041 Nontoxic single thyroid nodule: Secondary | ICD-10-CM | POA: Diagnosis not present

## 2022-07-13 ENCOUNTER — Ambulatory Visit
Admission: RE | Admit: 2022-07-13 | Discharge: 2022-07-13 | Disposition: A | Discharge status: Home / Self Care | Payer: Medicare Other | Source: Ambulatory Visit | Attending: Nurse Practitioner | Admitting: Nurse Practitioner

## 2022-07-13 DIAGNOSIS — Z780 Asymptomatic menopausal state: Secondary | ICD-10-CM | POA: Diagnosis not present

## 2022-07-13 DIAGNOSIS — E2839 Other primary ovarian failure: Secondary | ICD-10-CM

## 2022-07-16 LAB — CYTOLOGY - NON PAP

## 2022-07-17 ENCOUNTER — Encounter: Payer: Self-pay | Admitting: Nurse Practitioner

## 2022-07-25 ENCOUNTER — Other Ambulatory Visit: Payer: Self-pay | Admitting: Nurse Practitioner

## 2022-07-25 ENCOUNTER — Other Ambulatory Visit: Payer: Self-pay | Admitting: Physician Assistant

## 2022-07-25 DIAGNOSIS — G8929 Other chronic pain: Secondary | ICD-10-CM

## 2022-08-01 ENCOUNTER — Telehealth: Payer: Self-pay

## 2022-08-01 NOTE — Telephone Encounter (Signed)
Called pt she is advised of her lab results and recommendtion

## 2022-08-24 ENCOUNTER — Other Ambulatory Visit: Payer: Self-pay | Admitting: Nurse Practitioner

## 2022-08-24 DIAGNOSIS — M25511 Pain in right shoulder: Secondary | ICD-10-CM
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
## 2022-09-05 ENCOUNTER — Encounter: Payer: Self-pay | Admitting: Nurse Practitioner

## 2022-09-05 ENCOUNTER — Ambulatory Visit (INDEPENDENT_AMBULATORY_CARE_PROVIDER_SITE_OTHER): Payer: Medicare Other | Admitting: Nurse Practitioner

## 2022-09-05 VITALS — BP 113/76 | HR 83 | Resp 18 | Ht 64.17 in | Wt 221.0 lb

## 2022-09-05 DIAGNOSIS — E119 Type 2 diabetes mellitus without complications: Secondary | ICD-10-CM

## 2022-09-05 DIAGNOSIS — L02212 Cutaneous abscess of back [any part, except buttock]: Secondary | ICD-10-CM | POA: Diagnosis not present

## 2022-09-05 LAB — POCT GLYCOSYLATED HEMOGLOBIN (HGB A1C): HbA1c POC (<> result, manual entry): 6.4 % (ref 4.0–5.6)

## 2022-09-05 MED ORDER — LIDOCAINE VISCOUS HCL 2 % MT SOLN: OROMUCOSAL | 0 refills | Status: DC

## 2022-09-05 MED ORDER — CLINDAMYCIN HCL 300 MG PO CAPS: 300.0000 mg | ORAL_CAPSULE | Freq: Three times a day (TID) | ORAL | 0 refills | Status: DC

## 2022-09-10 ENCOUNTER — Other Ambulatory Visit (HOSPITAL_COMMUNITY): Payer: Self-pay | Admitting: Psychiatry

## 2022-09-10 DIAGNOSIS — F418 Other specified anxiety disorders: Secondary | ICD-10-CM

## 2022-09-10 DIAGNOSIS — F4310 Post-traumatic stress disorder, unspecified: Secondary | ICD-10-CM

## 2022-09-12 ENCOUNTER — Other Ambulatory Visit (HOSPITAL_COMMUNITY): Payer: Self-pay | Admitting: Psychiatry

## 2022-09-12 DIAGNOSIS — H8109 Meniere's disease, unspecified ear: Secondary | ICD-10-CM

## 2022-09-13 ENCOUNTER — Ambulatory Visit (INDEPENDENT_AMBULATORY_CARE_PROVIDER_SITE_OTHER): Payer: Medicare Other | Admitting: Nurse Practitioner

## 2022-09-13 ENCOUNTER — Encounter: Payer: Self-pay | Admitting: Nurse Practitioner

## 2022-09-13 ENCOUNTER — Telehealth: Payer: Self-pay

## 2022-09-13 VITALS — BP 126/81 | HR 66 | Ht 64.0 in | Wt 216.8 lb

## 2022-09-13 DIAGNOSIS — E1159 Type 2 diabetes mellitus with other circulatory complications: Secondary | ICD-10-CM

## 2022-09-13 DIAGNOSIS — E119 Type 2 diabetes mellitus without complications: Secondary | ICD-10-CM | POA: Diagnosis not present

## 2022-09-13 DIAGNOSIS — I152 Hypertension secondary to endocrine disorders: Secondary | ICD-10-CM | POA: Diagnosis not present

## 2022-09-13 DIAGNOSIS — Z1211 Encounter for screening for malignant neoplasm of colon: Secondary | ICD-10-CM

## 2022-09-13 DIAGNOSIS — L02212 Cutaneous abscess of back [any part, except buttock]: Secondary | ICD-10-CM | POA: Diagnosis not present

## 2022-09-13 DIAGNOSIS — J449 Chronic obstructive pulmonary disease, unspecified: Secondary | ICD-10-CM | POA: Diagnosis not present

## 2022-09-13 MED ORDER — CLINDAMYCIN HCL 300 MG PO CAPS: 300.0000 mg | ORAL_CAPSULE | Freq: Three times a day (TID) | ORAL | 0 refills | Status: DC

## 2022-09-13 MED ORDER — RELION TRUE METRIX TEST STRIPS VI STRP: ORAL_STRIP | 12 refills | Status: DC

## 2022-09-13 NOTE — Progress Notes (Signed)
Established patient visit   Patient: SHARYL PANCHAL   DOB: April 11, 1956   66 y.o. Female  MRN: [id number] Visit Date: 09/13/2022   Chief Complaint  Patient presents with   Follow-up   Subjective    HPI  Follow up -abscess on mid back.  --currently on antibiotics.  --draining pustular fluid routinely  --contains three separate tunnel like lesions in one very red abscess on the back.   -blood sugars well controlled  --HgbA1c 6.4  --she does need to have new test strips.  -will call the office to advise [place] of type of meter she has so we can send correct strips.  -doing well on ozempic.  -has lost additional 5 pounds since her most recent visit.   Patient has no new concerns or complaints today    Medications: Outpatient Medications Prior to Visit  Medication Sig   albuterol (PROVENTIL) (2.5 MG/3ML) 0.083% nebulizer solution Use 1 vial nebulized every 6 hours as needed for shortness of breath   chlorhexidine (PERIDEX) 0.12 % solution Use as directed 15 mLs in the mouth or throat 2 (two) times daily.   Fluticasone-Umeclidin-Vilant (TRELEGY ELLIPTA) 100-62.5-25 MCG/ACT AEPB Inhale 1 puff into the lungs daily.   Glucose Blood (RELION TRUE METRIX TEST STRIPS VI) 1 Stick by In Vitro route in the morning, at noon, and at bedtime.   hydrochlorothiazide (HYDRODIURIL) 25 MG tablet TAKE 1 TABLET (25 MG TOTAL) BY MOUTH DAILY.   ibuprofen (ADVIL) 600 MG tablet Take 1 tablet (600 mg total) by mouth every 8 (eight) hours as needed.   lidocaine (XYLOCAINE) 2 % solution Apply small amount to effected areas every 3 hours as needed   metoprolol tartrate (LOPRESSOR) 25 MG tablet TAKE 1 TABLET BY MOUTH TWICE A DAY WITH FOOD   Semaglutide,0.25 or 0.5MG/DOS, (OZEMPIC, 0.25 OR 0.5 MG/DOSE,) 2 MG/3ML SOPN Inject 0.5 mg into the skin once a week.   [DISCONTINUED] clindamycin (CLEOCIN) 300 MG capsule Take 1 capsule (300 mg total) by mouth 3 (three) times daily.   [DISCONTINUED] cyclobenzaprine (FLEXERIL) 10  MG tablet TAKE 1 TABLET BY MOUTH 2 TIMES DAILY AS NEEDED.   [DISCONTINUED] LORazepam (ATIVAN) 0.5 MG tablet Take 1 tablet (0.5 mg total) by mouth daily as needed for anxiety.   [DISCONTINUED] sertraline (ZOLOFT) 100 MG tablet Take 1 tablet (100 mg total) by mouth daily.   No facility-administered medications prior to visit.    Review of Systems  Constitutional:  Negative for activity change, appetite change, chills, fatigue and fever.  HENT:  Negative for congestion, postnasal drip, rhinorrhea, sinus pressure, sinus pain, sneezing and sore throat.   Eyes: Negative.   Respiratory:  Negative for cough, chest tightness, shortness of breath and wheezing.   Cardiovascular:  Negative for chest pain and palpitations.  Gastrointestinal:  Negative for abdominal pain, constipation, diarrhea, nausea and vomiting.  Endocrine: Negative for cold intolerance, heat intolerance, polydipsia and polyuria.       Blood sugars doing well    Genitourinary:  Negative for dyspareunia, dysuria, flank pain, frequency and urgency.  Musculoskeletal:  Negative for arthralgias, back pain and myalgias.  Skin:  Negative for rash.       Lesion in center of her back which is very red and tender. Improved some since last visit.   Allergic/Immunologic: Negative for environmental allergies.  Neurological:  Negative for dizziness, weakness and headaches.  Hematological:  Negative for adenopathy.  Psychiatric/Behavioral:  The patient is not nervous/anxious.     Last CBC Lab Results  Component Value Date   WBC 9.6 12/14/2021   HGB 14.0 12/14/2021   HCT 41.9 12/14/2021   MCV 88 12/14/2021   MCH 29.5 12/14/2021   RDW 13.1 12/14/2021   PLT 242 24/40/1027   Last metabolic panel Lab Results  Component Value Date   GLUCOSE 128 (H) 12/14/2021   NA 145 (H) 12/14/2021   K 4.0 12/14/2021   CL 101 12/14/2021   CO2 24 12/14/2021   BUN 14 12/14/2021   CREATININE 0.74 12/14/2021   EGFR 89 12/14/2021   CALCIUM 9.1  12/14/2021   PROT 6.5 12/14/2021   ALBUMIN 4.1 12/14/2021   LABGLOB 2.4 12/14/2021   AGRATIO 1.7 12/14/2021   BILITOT 0.4 12/14/2021   ALKPHOS 99 12/14/2021   AST 18 12/14/2021   ALT 16 12/14/2021   Last lipids Lab Results  Component Value Date   CHOL 181 12/14/2021   HDL 44 12/14/2021   LDLCALC 107 (H) 12/14/2021   TRIG 173 (H) 12/14/2021   CHOLHDL 4.1 12/14/2021   Last hemoglobin A1c Lab Results  Component Value Date   HGBA1C 6.4 09/05/2022   Last thyroid functions Lab Results  Component Value Date   TSH 2.010 12/14/2021       Objective     Today's Vitals   09/13/22 0856  BP: 126/81  Pulse: 66  SpO2: 95%  Weight: 216 lb 12.8 oz (98.3 kg)  Height: _0  (1.626 m)   Body mass index is 37.21 kg/m.  BP Readings from Last 3 Encounters:  09/13/22 126/81  09/05/22 113/76  06/14/22 109/73    Wt Readings from Last 3 Encounters:  09/13/22 216 lb 12.8 oz (98.3 kg)  09/05/22 221 lb (100.2 kg)  06/14/22 222 lb 1.9 oz (100.8 kg)    Physical Exam Vitals and nursing note reviewed.  Constitutional:      Appearance: Normal appearance. She is well-developed.  HENT:     Head: Normocephalic and atraumatic.  Eyes:     Pupils: Pupils are equal, round, and reactive to light.  Cardiovascular:     Rate and Rhythm: Normal rate and regular rhythm.     Pulses: Normal pulses.     Heart sounds: Normal heart sounds.  Pulmonary:     Effort: Pulmonary effort is normal.     Breath sounds: Normal breath sounds.  Abdominal:     Palpations: Abdomen is soft.  Musculoskeletal:        General: Normal range of motion.     Cervical back: Normal range of motion and neck supple.  Lymphadenopathy:     Cervical: No cervical adenopathy.  Skin:    General: Skin is warm and dry.     Capillary Refill: Capillary refill takes less than 2 seconds.     Findings: Abscess and lesion present.          Comments: Evidence of serosanguinous fluid drainage from lesion.   Neurological:      General: No focal deficit present.     Mental Status: She is alert and oriented to person, place, and time.  Psychiatric:        Mood and Affect: Mood normal.        Behavior: Behavior normal.        Thought Content: Thought content normal.        Judgment: Judgment normal.       Assessment & Plan    1. Type 2 diabetes mellitus without complication, without long-term current use of insulin (HCC) HgbA1c 6.4 today.  Continue diabetic medication as prescribed. Refer for diabetic eye exam  - Ambulatory referral to Ophthalmology - glucose blood (RELION TRUE METRIX TEST STRIPS) test strip; Test blood sugar 3 times daily  Dispense: 100 each; Refill: 12  2. Cutaneous abscess of back excluding buttocks Slight improvement from prior office visit. Continue clindamycin three times daily for additional 10 days. Refer to wound clinic for further treatment.  - clindamycin (CLEOCIN) 300 MG capsule; Take 1 capsule (300 mg total) by mouth 3 (three) times daily.  Dispense: 30 capsule; Refill: 0 - Ambulatory referral to Wound Clinic  3. Hypertension associated with diabetes ([place]) Stable. Continue bp medication as prescribed   4. Chronic obstructive pulmonary disease, unspecified COPD type ([place]) Continue use of inhalers and respiratory  medication as prescribed   5. Screening for colon cancer Order placed for Cologuard screening for colon cancer  - Cologuard   Problem List Items Addressed This Visit       Cardiovascular and Mediastinum   Hypertension associated with diabetes ([place])     Respiratory   Chronic obstructive pulmonary disease ([place])     Endocrine   Type 2 diabetes mellitus without complication, without long-term current use of insulin (HCC) - Primary   Relevant Medications   glucose blood (RELION TRUE METRIX TEST STRIPS) test strip   Other Relevant Orders   Ambulatory referral to Ophthalmology     Musculoskeletal and Integument   Cutaneous abscess of back excluding buttocks    Relevant Medications   clindamycin (CLEOCIN) 300 MG capsule   Other Relevant Orders   Ambulatory referral to Wound Clinic   Other Visit Diagnoses     Screening for colon cancer       Relevant Orders   Cologuard        Return in about 3 months (around 12/13/2022) for diabetes with HgbA1c check.         Ronnell Freshwater, NP  [facility] at [facility] [phone] (phone) [phone] (fax)  [place]

## 2022-09-13 NOTE — Telephone Encounter (Signed)
Pt was calling back to request test strips for Reli-on meter.

## 2022-09-13 NOTE — Telephone Encounter (Signed)
True Metrix Relion test strips has been sent to pharmacy.

## 2022-09-14 ENCOUNTER — Other Ambulatory Visit (HOSPITAL_COMMUNITY): Payer: Self-pay | Admitting: *Deleted

## 2022-09-14 DIAGNOSIS — H8109 Meniere's disease, unspecified ear: Secondary | ICD-10-CM

## 2022-09-14 DIAGNOSIS — F4310 Post-traumatic stress disorder, unspecified: Secondary | ICD-10-CM

## 2022-09-14 DIAGNOSIS — F418 Other specified anxiety disorders: Secondary | ICD-10-CM

## 2022-09-14 MED ORDER — SERTRALINE HCL 100 MG PO TABS: 100.0000 mg | ORAL_TABLET | Freq: Every day | ORAL | 0 refills | Status: DC

## 2022-09-14 MED ORDER — LORAZEPAM 0.5 MG PO TABS: 0.5000 mg | ORAL_TABLET | Freq: Every day | ORAL | 0 refills | Status: DC | PRN

## 2022-09-20 ENCOUNTER — Other Ambulatory Visit: Payer: Self-pay | Admitting: Nurse Practitioner

## 2022-09-20 ENCOUNTER — Ambulatory Visit (HOSPITAL_BASED_OUTPATIENT_CLINIC_OR_DEPARTMENT_OTHER): Payer: Medicare Other | Admitting: Psychiatry

## 2022-09-20 ENCOUNTER — Telehealth: Payer: Self-pay | Admitting: *Deleted

## 2022-09-20 ENCOUNTER — Encounter (HOSPITAL_COMMUNITY): Payer: Self-pay | Admitting: Psychiatry

## 2022-09-20 DIAGNOSIS — L209 Atopic dermatitis, unspecified: Secondary | ICD-10-CM

## 2022-09-20 DIAGNOSIS — F418 Other specified anxiety disorders: Secondary | ICD-10-CM | POA: Diagnosis not present

## 2022-09-20 DIAGNOSIS — H8109 Meniere's disease, unspecified ear: Secondary | ICD-10-CM | POA: Diagnosis not present

## 2022-09-20 DIAGNOSIS — F4310 Post-traumatic stress disorder, unspecified: Secondary | ICD-10-CM

## 2022-09-20 MED ORDER — LORAZEPAM 0.5 MG PO TABS: 0.5000 mg | ORAL_TABLET | Freq: Every day | ORAL | 2 refills | Status: DC | PRN

## 2022-09-20 MED ORDER — CLOBETASOL PROPIONATE 0.05 % EX CREA: 1.0000 | TOPICAL_CREAM | Freq: Two times a day (BID) | CUTANEOUS | 0 refills | Status: DC

## 2022-09-20 MED ORDER — SERTRALINE HCL 100 MG PO TABS: 100.0000 mg | ORAL_TABLET | Freq: Every day | ORAL | 2 refills | Status: DC
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
## 2022-09-20 NOTE — Telephone Encounter (Signed)
I have sent a prescription for clobetasol cream to the pharmacy. This should be applied to areas twice daily as needed. If rash does not improve, she should be seen again. Thanks so much.   -HB

## 2022-09-20 NOTE — Telephone Encounter (Signed)
Patient called saying that she has had a rash for about 4 days it is mostly on the left side on her arm, breast and stomach. She is requesting something a little stronger that OTC.  She said she had tried cortisone and that hasn't helped, Please advise. Marland KitchenApril Zimmerman Mcintyre, CMA

## 2022-09-24 ENCOUNTER — Other Ambulatory Visit: Payer: Self-pay | Admitting: Nurse Practitioner

## 2022-09-24 DIAGNOSIS — G8929 Other chronic pain: Secondary | ICD-10-CM

## 2022-09-25 NOTE — Telephone Encounter (Signed)
LVM to call office to confirm she picked up her Rx. Angela Mcintyre, CMA

## 2022-09-27 NOTE — Telephone Encounter (Signed)
Pt had picked up the medication and reports that the rash is almost gone.

## 2022-10-23 ENCOUNTER — Ambulatory Visit: Payer: Medicare Other | Admitting: Internal Medicine

## 2022-10-25 ENCOUNTER — Other Ambulatory Visit: Payer: Self-pay | Admitting: Nurse Practitioner

## 2022-10-25 ENCOUNTER — Telehealth: Payer: Self-pay | Admitting: *Deleted

## 2022-10-25 DIAGNOSIS — R052 Subacute cough: Secondary | ICD-10-CM

## 2022-10-25 MED ORDER — BENZONATATE 200 MG PO CAPS: 200.0000 mg | ORAL_CAPSULE | Freq: Two times a day (BID) | ORAL | 0 refills | Status: DC | PRN

## 2022-10-25 NOTE — Telephone Encounter (Signed)
The best thing she can take over the counter is coricidin HBP for multi symptom relief. I did send her a prescription for tessalon perls. These can be taken up to three times daily as needed for cough. She should rest and increase fluids and use inhalers as prescribed.

## 2022-10-25 NOTE — Telephone Encounter (Signed)
Pt informed of below.Angela Mcintyre, CMA ? ?

## 2022-10-25 NOTE — Telephone Encounter (Signed)
Pt calling stating that she has a cold and a very bad cough and is wanting to see if she could have something called in for the cough due to her breathing.  She said she does not have covid or the flu and no fever. She said she also has high blood pressure so she is wanting something she can take with this. Please advise.

## 2022-11-12 ENCOUNTER — Other Ambulatory Visit: Payer: Self-pay | Admitting: Nurse Practitioner

## 2022-11-12 DIAGNOSIS — G8929 Other chronic pain: Secondary | ICD-10-CM

## 2022-11-12 DIAGNOSIS — I10 Essential (primary) hypertension: Secondary | ICD-10-CM

## 2022-12-03 ENCOUNTER — Other Ambulatory Visit: Payer: Self-pay | Admitting: Nurse Practitioner

## 2022-12-03 DIAGNOSIS — G8929 Other chronic pain: Secondary | ICD-10-CM

## 2022-12-12 NOTE — Progress Notes (Unsigned)
Established patient visit   Patient: Angela Mcintyre   DOB: 06/13/1956   67 y.o. Female  MRN: [id number] Visit Date: 12/13/2022   No chief complaint on file.  Subjective    HPI  Follow up  -type 2 diabetes  --due for check HgbA1c  --due for urine microalbumin  -hypertension  -sees psychiatry for PTSD  -needs to have cologuard    Medications: Outpatient Medications Prior to Visit  Medication Sig   benzonatate (TESSALON) 200 MG capsule Take 1 capsule (200 mg total) by mouth 2 (two) times daily as needed for cough.   albuterol (PROVENTIL) (2.5 MG/3ML) 0.083% nebulizer solution Use 1 vial nebulized every 6 hours as needed for shortness of breath   chlorhexidine (PERIDEX) 0.12 % solution Use as directed 15 mLs in the mouth or throat 2 (two) times daily.   clindamycin (CLEOCIN) 300 MG capsule Take 1 capsule (300 mg total) by mouth 3 (three) times daily.   clobetasol cream (TEMOVATE) [id number] % Apply 1 Application topically 2 (two) times daily. Failed OTC treatment of hydrocortisone   cyclobenzaprine (FLEXERIL) 10 MG tablet TAKE 1 TABLET BY MOUTH 2 TIMES DAILY AS NEEDED.   Fluticasone-Umeclidin-Vilant (TRELEGY ELLIPTA) 100-62.5-25 MCG/ACT AEPB Inhale 1 puff into the lungs daily.   Glucose Blood (RELION TRUE METRIX TEST STRIPS VI) 1 Stick by In Vitro route in the morning, at noon, and at bedtime.   glucose blood (RELION TRUE METRIX TEST STRIPS) test strip Test blood sugar 3 times daily   hydrochlorothiazide (HYDRODIURIL) 25 MG tablet TAKE 1 TABLET (25 MG TOTAL) BY MOUTH DAILY.   ibuprofen (ADVIL) 600 MG tablet Take 1 tablet (600 mg total) by mouth every 8 (eight) hours as needed.   lidocaine (XYLOCAINE) 2 % solution Apply small amount to effected areas every 3 hours as needed   LORazepam (ATIVAN) 0.5 MG tablet Take 1 tablet (0.5 mg total) by mouth daily as needed for anxiety.   metoprolol tartrate (LOPRESSOR) 25 MG tablet TAKE 1 TABLET BY MOUTH TWICE A DAY WITH FOOD   Semaglutide,0.25 or  0.'5MG'$ /DOS, (OZEMPIC, 0.25 OR 0.5 MG/DOSE,) 2 MG/3ML SOPN Inject 0.5 mg into the skin once a week.   sertraline (ZOLOFT) 100 MG tablet Take 1 tablet (100 mg total) by mouth daily.   No facility-administered medications prior to visit.    Review of Systems  {Labs (Optional):23779}   Objective    There were no vitals filed for this visit. There is no height or weight on file to calculate BMI.  BP Readings from Last 3 Encounters:  09/13/22 126/81  09/05/22 113/76  06/14/22 109/73    Wt Readings from Last 3 Encounters:  09/13/22 216 lb 12.8 oz (98.3 kg)  09/05/22 221 lb (100.2 kg)  06/14/22 222 lb 1.9 oz (100.8 kg)    Physical Exam  ***  No results found for any visits on 12/13/22.  Assessment & Plan     Problem List Items Addressed This Visit   None    No follow-ups on file.         Ronnell Freshwater, NP  [facility] at [facility] [phone] (phone) [phone] (fax)  [place]

## 2022-12-13 ENCOUNTER — Ambulatory Visit (INDEPENDENT_AMBULATORY_CARE_PROVIDER_SITE_OTHER): Payer: 59 | Admitting: Nurse Practitioner

## 2022-12-13 ENCOUNTER — Encounter: Payer: Self-pay | Admitting: Nurse Practitioner

## 2022-12-13 VITALS — BP 130/78 | HR 70 | Ht 64.0 in | Wt 206.8 lb

## 2022-12-13 DIAGNOSIS — G8929 Other chronic pain: Secondary | ICD-10-CM | POA: Diagnosis not present

## 2022-12-13 DIAGNOSIS — J449 Chronic obstructive pulmonary disease, unspecified: Secondary | ICD-10-CM | POA: Diagnosis not present

## 2022-12-13 DIAGNOSIS — M25511 Pain in right shoulder: Secondary | ICD-10-CM

## 2022-12-13 DIAGNOSIS — E119 Type 2 diabetes mellitus without complications: Secondary | ICD-10-CM

## 2022-12-13 DIAGNOSIS — I10 Essential (primary) hypertension: Secondary | ICD-10-CM

## 2022-12-13 DIAGNOSIS — I152 Hypertension secondary to endocrine disorders: Secondary | ICD-10-CM | POA: Diagnosis not present

## 2022-12-13 DIAGNOSIS — E1159 Type 2 diabetes mellitus with other circulatory complications: Secondary | ICD-10-CM | POA: Diagnosis not present

## 2022-12-13 LAB — POCT GLYCOSYLATED HEMOGLOBIN (HGB A1C): HbA1c POC (<> result, manual entry): 6.1 % (ref 4.0–5.6)

## 2022-12-13 MED ORDER — METOPROLOL TARTRATE 25 MG PO TABS: 25.0000 mg | ORAL_TABLET | Freq: Two times a day (BID) | ORAL | 3 refills | Status: DC

## 2022-12-13 MED ORDER — CYCLOBENZAPRINE HCL 10 MG PO TABS: ORAL_TABLET | ORAL | 3 refills | Status: DC

## 2022-12-13 MED ORDER — HYDROCHLOROTHIAZIDE 25 MG PO TABS: 25.0000 mg | ORAL_TABLET | Freq: Every day | ORAL | 3 refills | Status: DC

## 2022-12-20 ENCOUNTER — Encounter (HOSPITAL_COMMUNITY): Payer: Self-pay | Admitting: Psychiatry

## 2022-12-20 ENCOUNTER — Other Ambulatory Visit (HOSPITAL_COMMUNITY): Payer: Self-pay | Admitting: Psychiatry

## 2022-12-20 ENCOUNTER — Telehealth (HOSPITAL_BASED_OUTPATIENT_CLINIC_OR_DEPARTMENT_OTHER): Payer: 59 | Admitting: Psychiatry

## 2022-12-20 DIAGNOSIS — F4310 Post-traumatic stress disorder, unspecified: Secondary | ICD-10-CM | POA: Diagnosis not present

## 2022-12-20 DIAGNOSIS — H8109 Meniere's disease, unspecified ear: Secondary | ICD-10-CM

## 2022-12-20 DIAGNOSIS — F418 Other specified anxiety disorders: Secondary | ICD-10-CM

## 2022-12-20 MED ORDER — SERTRALINE HCL 100 MG PO TABS: 100.0000 mg | ORAL_TABLET | Freq: Every day | ORAL | 2 refills | Status: DC

## 2022-12-20 MED ORDER — LORAZEPAM 0.5 MG PO TABS: 0.5000 mg | ORAL_TABLET | Freq: Every day | ORAL | 2 refills | Status: DC | PRN
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
## 2023-01-30 ENCOUNTER — Encounter: Payer: Self-pay | Admitting: Nurse Practitioner

## 2023-01-30 DIAGNOSIS — Z1231 Encounter for screening mammogram for malignant neoplasm of breast: Secondary | ICD-10-CM

## 2023-01-31 ENCOUNTER — Other Ambulatory Visit: Payer: Self-pay | Admitting: Nurse Practitioner

## 2023-01-31 DIAGNOSIS — Z1231 Encounter for screening mammogram for malignant neoplasm of breast: Secondary | ICD-10-CM

## 2023-02-04 ENCOUNTER — Encounter: Payer: Self-pay | Admitting: Internal Medicine

## 2023-02-04 ENCOUNTER — Ambulatory Visit (INDEPENDENT_AMBULATORY_CARE_PROVIDER_SITE_OTHER): Payer: 59 | Admitting: Internal Medicine

## 2023-02-04 VITALS — BP 126/74 | HR 84 | Ht 64.0 in | Wt 200.0 lb

## 2023-02-04 DIAGNOSIS — E785 Hyperlipidemia, unspecified: Secondary | ICD-10-CM | POA: Diagnosis not present

## 2023-02-04 DIAGNOSIS — E041 Nontoxic single thyroid nodule: Secondary | ICD-10-CM

## 2023-02-04 DIAGNOSIS — E119 Type 2 diabetes mellitus without complications: Secondary | ICD-10-CM | POA: Diagnosis not present

## 2023-02-04 MED ORDER — OZEMPIC (0.25 OR 0.5 MG/DOSE) 2 MG/3ML ~~LOC~~ SOPN: 0.5000 mg | PEN_INJECTOR | SUBCUTANEOUS | 3 refills | Status: DC

## 2023-02-04 NOTE — Patient Instructions (Signed)
-   Continue Ozempic 0.5 mg weekly     HOW TO TREAT LOW BLOOD SUGARS (Blood sugar LESS THAN 70 MG/DL) Please follow the RULE OF 15 for the treatment of hypoglycemia treatment (when your (blood sugars are less than 70 mg/dL)   STEP 1: Take 15 grams of carbohydrates when your blood sugar is low, which includes:  3-4 GLUCOSE TABS  OR 3-4 OZ OF JUICE OR REGULAR SODA OR ONE TUBE OF GLUCOSE GEL    STEP 2: RECHECK blood sugar in 15 MINUTES STEP 3: If your blood sugar is still low at the 15 minute recheck --> then, go back to STEP 1 and treat AGAIN with another 15 grams of carbohydrates. 

## 2023-02-04 NOTE — Progress Notes (Signed)
Name: Angela Mcintyre  MRN/ DOB: [id number], December 13, 1955   Age/ Sex: 67 y.o., female    PCP: Carlean Jews, NP   Reason for Endocrinology Evaluation: Type 2 Diabetes Mellitus     Date of Initial Endocrinology Visit: 06/05/2022    PATIENT IDENTIFIER: Angela Mcintyre is a 67 y.o. female with a past medical history of DM, Right thyroid nodules, HTN and COPD . The patient presented for initial endocrinology clinic visit on 06/05/2022  for consultative assistance with her diabetes management.    HPI: Angela Mcintyre was    Diagnosed with DM 2022 Prior Medications tried/Intolerance: Metformin- deathly sick . Started Ozempic 04/2022              Hemoglobin A1c has ranged from 6.9% in 12/2021, peaking at 7.9% in 04/2022.  On her initial visit to our clinic, her A1c was 7.9%, she was on Ozempic only which we increased  THYROID HISTORY: Patient was noted without right thyroid nodule on physical exam which prompted thyroid ultrasound showing a right  3.9 cm nodule meeting FNA criteria on 12/27/2021 She has a hx of FNA ~ 2 yrs ago while living in [place]   No Fh of thyroid disease Denies radiation exposure nor neck sx   She is s/p right 3.9 cm thyroid nodule 07/12/2022 with benign cytology  SUBJECTIVE:   During the last visit (06/05/2022): A1c 7.9%  Today (02/04/23): Angela Mcintyre is here for follow-up on diabetes management and right thyroid nodule.  She checks her blood sugars occasionally  times daily. The patient has not  had hypoglycemic episodes since the last clinic visit.  Patient follows with behavioral health Denies nausea, vomiting or diarrhea  She continues with weight loss  Denies local neck swelling  Denies palpitations    HOME DIABETES REGIMEN: Ozempic 0.5 mg weekly    Statin: No ACE-I/ARB: No   METER DOWNLOAD SUMMARY: unable to download 90 day average 133 mg/dL     DIABETIC COMPLICATIONS: Microvascular complications:   Denies: neuropathy, CKD  Last eye  exam: Completed never   Macrovascular complications:   Denies: CAD, PVD, CVA   PAST HISTORY: Past Medical History:  Past Medical History:  Diagnosis Date   High blood pressure    Past Surgical History:  Past Surgical History:  Procedure Laterality Date   CESAREAN SECTION  1973   CESAREAN SECTION      Social History:  reports that she has quit smoking. Her smoking use included cigarettes. She has never used smokeless tobacco. She reports that she does not currently use alcohol. She reports that she does not currently use drugs. Family History:  Family History  Problem Relation Age of Onset   Breast cancer Neg Hx      HOME MEDICATIONS: Allergies as of 02/04/2023       Reactions   Gabapentin Swelling        Medication List        Accurate as of February 04, 2023  8:51 AM. If you have any questions, ask your nurse or doctor.          albuterol (2.5 MG/3ML) 0.083% nebulizer solution Commonly known as: PROVENTIL Use 1 vial nebulized every 6 hours as needed for shortness of breath   cyclobenzaprine 10 MG tablet Commonly known as: FLEXERIL Take 1 tablet po QHS prn muscle pain/spasms   Fluzone High-Dose Quadrivalent 0.7 ML Susy Generic drug: Influenza Vac High-Dose Quad   hydrochlorothiazide 25 MG tablet Commonly known as: HYDRODIURIL  Take 1 tablet (25 mg total) by mouth daily.   LORazepam 0.5 MG tablet Commonly known as: ATIVAN Take 1 tablet (0.5 mg total) by mouth daily as needed for anxiety.   metoprolol tartrate 25 MG tablet Commonly known as: LOPRESSOR Take 1 tablet (25 mg total) by mouth 2 (two) times daily with a meal.   Ozempic (0.25 or 0.5 MG/DOSE) 2 MG/3ML Sopn Generic drug: Semaglutide(0.25 or 0.5MG /DOS) Inject 0.5 mg into the skin once a week.   RELION TRUE METRIX TEST STRIPS VI 1 Stick by In Vitro route in the morning, at noon, and at bedtime.   ReliOn True Metrix Test Strips test strip Generic drug: glucose blood Test blood sugar 3 times  daily   sertraline 100 MG tablet Commonly known as: ZOLOFT Take 1 tablet (100 mg total) by mouth daily.   Trelegy Ellipta 100-62.5-25 MCG/ACT Aepb Generic drug: Fluticasone-Umeclidin-Vilant Inhale 1 puff into the lungs daily.         ALLERGIES: Allergies  Allergen Reactions   Gabapentin Swelling     REVIEW OF SYSTEMS: A comprehensive ROS was conducted with the patient and is negative except as per HPI    OBJECTIVE:   VITAL SIGNS: BP 126/74 (BP Location: Left Arm, Patient Position: Sitting, Cuff Size: Large)   Pulse 84   Ht 5\' 4"  (1.626 m)   Wt 200 lb (90.7 kg)   SpO2 95%   BMI 34.33 kg/m    PHYSICAL EXAM:  General: Pt appears well and is in NAD  Neck: General: Supple without adenopathy or carotid bruits. Thyroid: Thyroid size normal.  Right thyroid nodule appreciated  Lungs: Clear with good BS bilat   Heart: RRR   Abdomen: soft, nontender, without masses or organomegaly palpable  Extremities:  Lower extremities - No pretibial edema  Neuro: MS is good with appropriate affect, pt is alert and Ox3    DM foot exam: 02/04/2023  The skin of the feet is intact without sores or ulcerations. The pedal pulses are 2+ on right and 2+ on left. The sensation is intact to a screening 5.07, 10 gram monofilament bilaterally   DATA REVIEWED:  Lab Results  Component Value Date   HGBA1C 6.1 12/13/2022   HGBA1C 6.4 09/05/2022   HGBA1C 7.9 (A) 05/03/2022   Lab Results  Component Value Date   MICROALBUR 10mg /l 12/21/2021   LDLCALC 107 (H) 12/14/2021   CREATININE 0.74 12/14/2021   Lab Results  Component Value Date   MICRALBCREAT 30mg /g 12/21/2021    Lab Results  Component Value Date   CHOL 181 12/14/2021   HDL 44 12/14/2021   LDLCALC 107 (H) 12/14/2021   TRIG 173 (H) 12/14/2021   CHOLHDL 4.1 12/14/2021        Latest Reference Range & Units 12/14/21 08:25  TSH 0.450 - 4.500 uIU/mL 2.010     Thyroid ultrasound 12/27/2021  Nodule # 1:   Location: Right;  mid   Maximum size: 3.9 cm; Other 2 dimensions: 2.8 x 2.6 cm   Composition: solid/almost completely solid (2)   Echogenicity: hypoechoic (2)   Shape: not taller-than-wide (0)   Margins: smooth (0)   Echogenic foci: macrocalcifications (1)   ACR TI-RADS total points: 5.   ACR TI-RADS risk category: TR4 (4-6 points).   ACR TI-RADS recommendations:   **Given size (>/= 1.5 cm) and appearance, fine needle aspiration of this moderately suspicious nodule should be considered based on TI-RADS criteria.   _________________________________________________________   IMPRESSION: Nodule 1 (TI-RADS 4), measuring 3.9  cm, located in the mid right thyroid lobe, meets criteria for FNA.    FNA right thyroid nodule 07/12/2022  Clinical History: Nodule #1: Right; Mid, Maximum size: 3.9 cm; Other 2  dimensions: 2.8 x 2.6 cm, solid/almost completely solid, hypoechoic,  Echogenic foci: macrocalcifications: TI-RADS total points: 5.  Specimen Submitted:  A. THYROID, RT MID, FINE NEEDLE ASPIRATION:    FINAL MICROSCOPIC DIAGNOSIS:  - Consistent with benign follicular nodule (Bethesda category II)    ASSESSMENT / PLAN / RECOMMENDATIONS:   1) Type 2 Diabetes Mellitus, suboptimally controlled, With out at complications - Most recent A1c of 6.1 %. Goal A1c < 7.0 %.    -A1c at goal -She is intolerant to metformin -Referral has been placed to ophthalmology -We again emphasized importance of low-carb diet -Offered to repeat labs today, but the patient is scheduled for a physical in 2 months and will have labs then    MEDICATIONS: Continue Ozempic 0.5 mg weekly  EDUCATION / INSTRUCTIONS: BG monitoring instructions: Patient is instructed to check her blood sugars 2-3 times a week  2) Diabetic complications:  Eye: Does not have known diabetic retinopathy.  Neuro/ Feet: Does not have known diabetic peripheral neuropathy. Renal: Patient does not have known baseline CKD. She is not on an  ACEI/ARB at present.   3) Right Thyroid Nodule :  -No local neck symptoms  -Patient is clinically and biochemically euthyroid  -Per patient she has history of right thyroid nodule FNA a few years ago while living in [place] with an assumption of benign cytology (records not available) -She is s/p FNA of the right thyroid nodule 3.9 cm with benign cytology -An order for repeat ultrasound has been placed  4) Dyslipidemia :  - LDL above goal in the past  - She rarely eats red meat , discussed avoiding fried foods and following a low fat diet  -We also discussed statin therapy including cardiovascular benefits if LDL remains >100 mg/dL  -Patient will have labs through PCPs office during her physical in 2 months  Follow-up 6 months   Signed electronically by: Lyndle Herrlich, MD  [facility] Endocrinology  [facility] [address] [place] [place], [place] [address] Phone: [phone] FAX: [phone]   CC: Carlean Jews, NP [address] [place] [address] Phone: [phone]  Fax: [phone]    Return to Endocrinology clinic as below: Future Appointments  Date Time Provider Department Center  02/04/2023  9:10 AM Railey Glad, Konrad Dolores, MD [facility] None  03/13/2023 12:40 PM [facility] MM 2 [facility]  03/21/2023 10:40 AM Arfeen, Phillips Grout, MD [facility] None  04/08/2023  8:45 AM [facility] None  04/15/2023  8:30 AM Carlean Jews, NP [facility] None  06/13/2023  8:50 AM Carlean Jews, NP [facility] None

## 2023-02-26 ENCOUNTER — Telehealth: Payer: Self-pay

## 2023-02-26 NOTE — Telephone Encounter (Signed)
Contacted Ted Mcalpine Diodato to schedule their annual wellness visit. Appointment made for 02/28/23.  Agnes Lawrence, CMA (AAMA)  [facility] [phone]

## 2023-02-28 ENCOUNTER — Ambulatory Visit (INDEPENDENT_AMBULATORY_CARE_PROVIDER_SITE_OTHER): Payer: 59

## 2023-02-28 VITALS — Ht 64.0 in | Wt 195.0 lb

## 2023-02-28 DIAGNOSIS — Z0000 Encounter for general adult medical examination without abnormal findings: Secondary | ICD-10-CM

## 2023-02-28 NOTE — Patient Instructions (Addendum)
Angela Mcintyre , Thank you for taking time to come for your Medicare Wellness Visit. I appreciate your ongoing commitment to your health goals. Please review the following plan we discussed and let me know if I can assist you in the future.   These are the goals we discussed:  Goals       Lose weight (pt-stated)        This is a list of the screening recommended for you and due dates:  Health Maintenance  Topic Date Due   Eye exam for diabetics  Never done   Yearly kidney health urinalysis for diabetes  03/01/2023*   COVID-19 Vaccine (3 - 2023-24 season) 03/16/2023*   Yearly kidney function blood test for diabetes  05/31/2023*   Cologuard (Stool DNA test)  02/28/2024*   Flu Shot  05/16/2023   Hemoglobin A1C  06/13/2023   Complete foot exam   02/04/2024   Mammogram  02/23/2024   Medicare Annual Wellness Visit  02/28/2024   DTaP/Tdap/Td vaccine (2 - Td or Tdap) 12/22/2031   Pneumonia Vaccine  Completed   DEXA scan (bone density measurement)  Completed   Hepatitis C Screening: USPSTF Recommendation to screen - Ages 58-79 yo.  Completed   Zoster (Shingles) Vaccine  Completed   HPV Vaccine  Aged Out  *Topic was postponed. The date shown is not the original due date.    Advanced directives: Advance directive discussed with you today. Even though you declined this today, please call our office should you change your mind, and we can give you the proper paperwork for you to fill out.   Conditions/risks identified: None  Next appointment: Follow up in one year for your annual wellness visit     Preventive Care 65 Years and Older, Female Preventive care refers to lifestyle choices and visits with your health care provider that can promote health and wellness. What does preventive care include? A yearly physical exam. This is also called an annual well check. Dental exams once or twice a year. Routine eye exams. Ask your health care provider how often you should have your eyes  checked. Personal lifestyle choices, including: Daily care of your teeth and gums. Regular physical activity. Eating a healthy diet. Avoiding tobacco and drug use. Limiting alcohol use. Practicing safe sex. Taking low-dose aspirin every day. Taking vitamin and mineral supplements as recommended by your health care provider. What happens during an annual well check? The services and screenings done by your health care provider during your annual well check will depend on your age, overall health, lifestyle risk factors, and family history of disease. Counseling  Your health care provider may ask you questions about your: Alcohol use. Tobacco use. Drug use. Emotional well-being. Home and relationship well-being. Sexual activity. Eating habits. History of falls. Memory and ability to understand (cognition). Work and work Astronomer. Reproductive health. Screening  You may have the following tests or measurements: Height, weight, and BMI. Blood pressure. Lipid and cholesterol levels. These may be checked every 5 years, or more frequently if you are over 39 years old. Skin check. Lung cancer screening. You may have this screening every year starting at age 16 if you have a 30-pack-year history of smoking and currently smoke or have quit within the past 15 years. Fecal occult blood test (FOBT) of the stool. You may have this test every year starting at age 49. Flexible sigmoidoscopy or colonoscopy. You may have a sigmoidoscopy every 5 years or a colonoscopy every 10 years starting  at age 17. Hepatitis C blood test. Hepatitis B blood test. Sexually transmitted disease (STD) testing. Diabetes screening. This is done by checking your blood sugar (glucose) after you have not eaten for a while (fasting). You may have this done every 1-3 years. Bone density scan. This is done to screen for osteoporosis. You may have this done starting at age 59. Mammogram. This may be done every 1-2  years. Talk to your health care provider about how often you should have regular mammograms. Talk with your health care provider about your test results, treatment options, and if necessary, the need for more tests. Vaccines  Your health care provider may recommend certain vaccines, such as: Influenza vaccine. This is recommended every year. Tetanus, diphtheria, and acellular pertussis (Tdap, Td) vaccine. You may need a Td booster every 10 years. Zoster vaccine. You may need this after age 52. Pneumococcal 13-valent conjugate (PCV13) vaccine. One dose is recommended after age 36. Pneumococcal polysaccharide (PPSV23) vaccine. One dose is recommended after age 55. Talk to your health care provider about which screenings and vaccines you need and how often you need them. This information is not intended to replace advice given to you by your health care provider. Make sure you discuss any questions you have with your health care provider. Document Released: 10/28/2015 Document Revised: 06/20/2016 Document Reviewed: 08/02/2015 Elsevier Interactive Patient Education  2017 [place] Prevention in the Home Falls can cause injuries. They can happen to people of all ages. There are many things you can do to make your home safe and to help prevent falls. What can I do on the outside of my home? Regularly fix the edges of walkways and driveways and fix any cracks. Remove anything that might make you trip as you walk through a door, such as a raised step or threshold. Trim any bushes or trees on the path to your home. Use bright outdoor lighting. Clear any walking paths of anything that might make someone trip, such as rocks or tools. Regularly check to see if handrails are loose or broken. Make sure that both sides of any steps have handrails. Any raised decks and porches should have guardrails on the edges. Have any leaves, snow, or ice cleared regularly. Use sand or salt on walking paths  during winter. Clean up any spills in your garage right away. This includes oil or grease spills. What can I do in the bathroom? Use night lights. Install grab bars by the toilet and in the tub and shower. Do not use towel bars as grab bars. Use non-skid mats or decals in the tub or shower. If you need to sit down in the shower, use a plastic, non-slip stool. Keep the floor dry. Clean up any water that spills on the floor as soon as it happens. Remove soap buildup in the tub or shower regularly. Attach bath mats securely with double-sided non-slip rug tape. Do not have throw rugs and other things on the floor that can make you trip. What can I do in the bedroom? Use night lights. Make sure that you have a light by your bed that is easy to reach. Do not use any sheets or blankets that are too big for your bed. They should not hang down onto the floor. Have a firm chair that has side arms. You can use this for support while you get dressed. Do not have throw rugs and other things on the floor that can make you trip. What can  I do in the kitchen? Clean up any spills right away. Avoid walking on wet floors. Keep items that you use a lot in easy-to-reach places. If you need to reach something above you, use a strong step stool that has a grab bar. Keep electrical cords out of the way. Do not use floor polish or wax that makes floors slippery. If you must use wax, use non-skid floor wax. Do not have throw rugs and other things on the floor that can make you trip. What can I do with my stairs? Do not leave any items on the stairs. Make sure that there are handrails on both sides of the stairs and use them. Fix handrails that are broken or loose. Make sure that handrails are as long as the stairways. Check any carpeting to make sure that it is firmly attached to the stairs. Fix any carpet that is loose or worn. Avoid having throw rugs at the top or bottom of the stairs. If you do have throw  rugs, attach them to the floor with carpet tape. Make sure that you have a light switch at the top of the stairs and the bottom of the stairs. If you do not have them, ask someone to add them for you. What else can I do to help prevent falls? Wear shoes that: Do not have high heels. Have rubber bottoms. Are comfortable and fit you well. Are closed at the toe. Do not wear sandals. If you use a stepladder: Make sure that it is fully opened. Do not climb a closed stepladder. Make sure that both sides of the stepladder are locked into place. Ask someone to hold it for you, if possible. Clearly mark and make sure that you can see: Any grab bars or handrails. First and last steps. Where the edge of each step is. Use tools that help you move around (mobility aids) if they are needed. These include: Canes. Walkers. Scooters. Crutches. Turn on the lights when you go into a dark area. Replace any light bulbs as soon as they burn out. Set up your furniture so you have a clear path. Avoid moving your furniture around. If any of your floors are uneven, fix them. If there are any pets around you, be aware of where they are. Review your medicines with your doctor. Some medicines can make you feel dizzy. This can increase your chance of falling. Ask your doctor what other things that you can do to help prevent falls. This information is not intended to replace advice given to you by your health care provider. Make sure you discuss any questions you have with your health care provider. Document Released: 07/28/2009 Document Revised: 03/08/2016 Document Reviewed: 11/05/2014 Elsevier Interactive Patient Education  2017 [employer].

## 2023-02-28 NOTE — Progress Notes (Signed)
Subjective:   Angela Mcintyre is a 67 y.o. female who presents for Medicare Annual (Subsequent) preventive examination.  Review of Systems    Virtual Visit via Telephone Note  I connected with  Angela Mcintyre on 02/28/23 at  1:30 PM EDT by telephone and verified that I am speaking with the correct person using two identifiers.  Location: Patient: Home Provider: Office Persons participating in the virtual visit: patient/Nurse Health Advisor   I discussed the limitations, risks, security and privacy concerns of performing an evaluation and management service by telephone and the availability of in person appointments. The patient expressed understanding and agreed to proceed.  Interactive audio and video telecommunications were attempted between this nurse and patient, however failed, due to patient having technical difficulties OR patient did not have access to video capability.  We continued and completed visit with audio only.  Some vital signs may be absent or patient reported.   Angela Rung, LPN  Cardiac Risk Factors include: advanced age (>21men, >35 women)     Objective:    Today's Vitals   02/28/23 1333  Weight: 195 lb (88.5 kg)  Height: 5\' 4"  (1.626 m)   Body mass index is 33.47 kg/m.     02/28/2023    1:48 PM  Advanced Directives  Does Patient Have a Medical Advance Directive? No  Would patient like information on creating a medical advance directive? No - Patient declined    Current Medications (verified) Outpatient Encounter Medications as of 02/28/2023  Medication Sig   albuterol (PROVENTIL) (2.5 MG/3ML) 0.083% nebulizer solution Use 1 vial nebulized every 6 hours as needed for shortness of breath   cyclobenzaprine (FLEXERIL) 10 MG tablet Take 1 tablet po QHS prn muscle pain/spasms   Fluticasone-Umeclidin-Vilant (TRELEGY ELLIPTA) 100-62.5-25 MCG/ACT AEPB Inhale 1 puff into the lungs daily.   FLUZONE HIGH-DOSE QUADRIVALENT 0.7 ML SUSY    Glucose Blood  (RELION TRUE METRIX TEST STRIPS VI) 1 Stick by In Vitro route in the morning, at noon, and at bedtime.   glucose blood (RELION TRUE METRIX TEST STRIPS) test strip Test blood sugar 3 times daily   hydrochlorothiazide (HYDRODIURIL) 25 MG tablet Take 1 tablet (25 mg total) by mouth daily.   LORazepam (ATIVAN) 0.5 MG tablet Take 1 tablet (0.5 mg total) by mouth daily as needed for anxiety.   metoprolol tartrate (LOPRESSOR) 25 MG tablet Take 1 tablet (25 mg total) by mouth 2 (two) times daily with a meal.   Semaglutide,0.25 or 0.5MG /DOS, (OZEMPIC, 0.25 OR 0.5 MG/DOSE,) 2 MG/3ML SOPN Inject 0.5 mg into the skin once a week.   sertraline (ZOLOFT) 100 MG tablet Take 1 tablet (100 mg total) by mouth daily.   No facility-administered encounter medications on file as of 02/28/2023.    Allergies (verified) Gabapentin   History: Past Medical History:  Diagnosis Date   High blood pressure    Past Surgical History:  Procedure Laterality Date   CESAREAN SECTION  1973   CESAREAN SECTION     Family History  Problem Relation Age of Onset   Breast cancer Neg Hx    Social History   Socioeconomic History   Marital status: Single    Spouse name: Not on file   Number of children: Not on file   Years of education: Not on file   Highest education level: Not on file  Occupational History   Not on file  Tobacco Use   Smoking status: Former    Types: Cigarettes  Smokeless tobacco: Never  Substance and Sexual Activity   Alcohol use: Not Currently   Drug use: Not Currently   Sexual activity: Yes    Partners: Male  Other Topics Concern   Not on file  Social History Narrative   Not on file   Social Determinants of Health   Financial Resource Strain: Low Risk  (02/28/2023)   Overall Financial Resource Strain (CARDIA)    Difficulty of Paying Living Expenses: Not hard at all  Food Insecurity: No Food Insecurity (02/28/2023)   Hunger Vital Sign    Worried About Running Out of Food in the Last  Year: Never true    Ran Out of Food in the Last Year: Never true  Transportation Needs: No Transportation Needs (02/28/2023)   PRAPARE - Administrator, Civil Service (Medical): No    Lack of Transportation (Non-Medical): No  Physical Activity: Inactive (02/28/2023)   Exercise Vital Sign    Days of Exercise per Week: 0 days    Minutes of Exercise per Session: 0 min  Stress: Stress Concern Present (02/28/2023)   [employer] of Occupational Health - Occupational Stress Questionnaire    Feeling of Stress : To some extent  Social Connections: Socially Isolated (02/28/2023)   Social Connection and Isolation Panel [NHANES]    Frequency of Communication with Friends and Family: More than three times a week    Frequency of Social Gatherings with Friends and Family: More than three times a week    Attends Religious Services: Never    Database administrator or Organizations: No    Attends Engineer, structural: Never    Marital Status: Divorced    Tobacco Counseling Counseling given: Not Answered   Clinical Intake:  Pre-visit preparation completed: Yes  Pain : No/denies pain Nutrition Risk Assessment:  Has the patient had any N/V/D within the last 2 months?  No  Does the patient have any non-healing wounds?  No  Has the patient had any unintentional weight loss or weight gain?  No   Diabetes:  Is the patient diabetic?  Yes  If diabetic, was a CBG obtained today?  No  Did the patient bring in their glucometer from home?  No  How often do you monitor your CBG's? 3 X weekly.   Financial Strains and Diabetes Management:  Are you having any financial strains with the device, your supplies or your medication? No .  Does the patient want to be seen by Chronic Care Management for management of their diabetes?  No  Would the patient like to be referred to a Nutritionist or for Diabetic Management?  No   Diabetic Exams:  Diabetic Eye Exam: Completed . Overdue for  diabetic eye exam. Pt has been advised about the importance in completing this exam. A referral has been placed today. Message sent to referral coordinator for scheduling purposes. Advised pt to expect a call from office referred to regarding appt.  Diabetic Foot Exam: Completed . Pt has been advised about the importance in completing this exam. Pt is scheduled for diabetic foot exam on Followed by PCP.      BMI - recorded: 33.47 Nutritional Status: BMI > 30  Obese Nutritional Risks: None Diabetes: No  How often do you need to have someone help you when you read instructions, pamphlets, or other written materials from your doctor or pharmacy?: 1 - Never  Diabetic? Yes  Interpreter Needed?: No  Information entered by :: Theresa Mulligan LPN  Activities of Daily Living    02/28/2023    1:43 PM 02/26/2023    5:42 PM  In your present state of health, do you have any difficulty performing the following activities:  Hearing? 0 0  Vision? 0 0  Difficulty concentrating or making decisions? 0 0  Walking or climbing stairs? 1 1  Comment Uses a cane PRN   Dressing or bathing? 0 0  Doing errands, shopping? 1 1  Comment Sister Advice worker and eating ? N N  Using the Toilet? N N  In the past six months, have you accidently leaked urine? Malvin Johns  Comment Wears pads.   Do you have problems with loss of bowel control? N N  Managing your Medications? N N  Managing your Finances? N N  Housekeeping or managing your Housekeeping? N N    Patient Care Team: Carlean Jews, NP as PCP - General (Family Medicine)  Indicate any recent Medical Services you may have received from other than [facility] providers in the past year (date may be approximate).     Assessment:   This is a routine wellness examination for Joshlynn.  Hearing/Vision screen Hearing Screening - Comments:: Denies hearing difficulties   Vision Screening - Comments:: Wears rx glasses - up to date with routine eye exams with   Dr Dione Booze  Dietary issues and exercise activities discussed: Current Exercise Habits: The patient does not participate in regular exercise at present, Exercise limited by: orthopedic condition(s)   Goals Addressed               This Visit's Progress     Lose weight (pt-stated)         Depression Screen    02/28/2023    1:42 PM 06/14/2022    9:32 AM 05/03/2022    9:16 AM 03/22/2022    9:08 AM 02/01/2022    9:21 AM 12/21/2021    9:42 AM 09/18/2021    3:49 PM  PHQ 2/9 Scores  PHQ - 2 Score 2 2 2  2 2 2   PHQ- 9 Score 4 9 8  11 8 8      Information is confidential and restricted. Go to Review Flowsheets to unlock data.    Fall Risk    02/28/2023    1:48 PM 02/26/2023    5:42 PM 09/05/2022    8:33 AM 09/18/2021    3:49 PM  Fall Risk   Falls in the past year? 0 0 0 0  Number falls in past yr: 0 0 0 0  Injury with Fall? 0 1 0 0  Risk for fall due to : No Fall Risks     Follow up Falls prevention discussed   Falls evaluation completed    FALL RISK PREVENTION PERTAINING TO THE HOME:  Any stairs in or around the home? Yes  If so, are there any without handrails? No  Home free of loose throw rugs in walkways, pet beds, electrical cords, etc? Yes  Adequate lighting in your home to reduce risk of falls? Yes   ASSISTIVE DEVICES UTILIZED TO PREVENT FALLS:  Life alert? No  Use of a cane, walker or w/c? Yes  Grab bars in the bathroom? No  Shower chair or bench in shower? No  Elevated toilet seat or a handicapped toilet? No   TIMED UP AND GO:  Was the test performed? No . Audio Visit   Cognitive Function:        02/28/2023  1:49 PM  6CIT Screen  What Year? 0 points  What month? 0 points  What time? 0 points  Count back from 20 0 points  Months in reverse 0 points  Repeat phrase 0 points  Total Score 0 points    Immunizations Immunization History  Administered Date(s) Administered   Influenza, High Dose Seasonal PF 07/19/2022   Influenza,inj,Quad PF,6+ Mos  08/06/2021   PFIZER(Purple Top)SARS-COV-2 Vaccination 01/04/2020, 01/26/2020   PNEUMOCOCCAL CONJUGATE-20 11/03/2021   Tdap 12/21/2021   Zoster Recombinat (Shingrix) 12/16/2017, 07/29/2020    TDAP status: Up to date  Flu Vaccine status: Up to date  Pneumococcal vaccine status: Up to date  Covid-19 vaccine status: Completed vaccines  Qualifies for Shingles Vaccine? Yes   Zostavax completed Yes   Shingrix Completed?: Yes  Screening Tests Health Maintenance  Topic Date Due   OPHTHALMOLOGY EXAM  Never done   Diabetic kidney evaluation - Urine ACR  03/01/2023 (Originally 12/22/2022)   COVID-19 Vaccine (3 - 2023-24 season) 03/16/2023 (Originally 06/15/2022)   Diabetic kidney evaluation - eGFR measurement  05/31/2023 (Originally 12/15/2022)   Fecal DNA (Cologuard)  02/28/2024 (Originally 07/16/2000)   INFLUENZA VACCINE  05/16/2023   HEMOGLOBIN A1C  06/13/2023   FOOT EXAM  02/04/2024   MAMMOGRAM  02/23/2024   Medicare Annual Wellness (AWV)  02/28/2024   DTaP/Tdap/Td (2 - Td or Tdap) 12/22/2031   Pneumonia Vaccine 88+ Years old  Completed   DEXA SCAN  Completed   Hepatitis C Screening  Completed   Zoster Vaccines- Shingrix  Completed   HPV VACCINES  Aged Out    Health Maintenance  Health Maintenance Due  Topic Date Due   OPHTHALMOLOGY EXAM  Never done    Colorectal cancer screening: Referral to GI placed Deferred. Pt aware the office will call re: appt.  Mammogram status: Ordered Scheduled for 03/13/23. Pt provided with contact info and advised to call to schedule appt.   Bone Density status: Completed 07/13/22. Results reflect: Bone density results: OSTEOPOROSIS. Repeat every   years.  Lung Cancer Screening: (Low Dose CT Chest recommended if Age 4-80 years, 30 pack-year currently smoking OR have quit w/in 15years.) does not qualify.     Additional Screening:  Hepatitis C Screening: does qualify; Completed 12/21/21  Vision Screening: Recommended annual ophthalmology exams for  early detection of glaucoma and other disorders of the eye. Is the patient up to date with their annual eye exam?  Yes  Who is the provider or what is the name of the office in which the patient attends annual eye exams? Dr Dione Booze If pt is not established with a provider, would they like to be referred to a provider to establish care? No .   Dental Screening: Recommended annual dental exams for proper oral hygiene  Community Resource Referral / Chronic Care Management:  CRR required this visit?  No   CCM required this visit?  No      Plan:     I have personally reviewed and noted the following in the patient's chart:   Medical and social history Use of alcohol, tobacco or illicit drugs  Current medications and supplements including opioid prescriptions. Patient is not currently taking opioid prescriptions. Functional ability and status Nutritional status Physical activity Advanced directives List of other physicians Hospitalizations, surgeries, and ER visits in previous 12 months Vitals Screenings to include cognitive, depression, and falls Referrals and appointments  In addition, I have reviewed and discussed with patient certain preventive protocols, quality metrics, and best practice  recommendations. A written personalized care plan for preventive services as well as general preventive health recommendations were provided to patient.     Angela Rung, LPN   1/61/0960   Nurse Notes: Patient due Diabetic kidney evaluation eGFR and Urine ACR

## 2023-03-13 ENCOUNTER — Ambulatory Visit
Admission: RE | Admit: 2023-03-13 | Discharge: 2023-03-13 | Disposition: A | Discharge status: Home / Self Care | Payer: 59 | Source: Ambulatory Visit | Attending: Nurse Practitioner | Admitting: Nurse Practitioner

## 2023-03-13 DIAGNOSIS — Z1231 Encounter for screening mammogram for malignant neoplasm of breast: Secondary | ICD-10-CM

## 2023-03-16 ENCOUNTER — Other Ambulatory Visit (HOSPITAL_COMMUNITY): Payer: Self-pay | Admitting: Psychiatry

## 2023-03-16 DIAGNOSIS — F4310 Post-traumatic stress disorder, unspecified: Secondary | ICD-10-CM

## 2023-03-16 DIAGNOSIS — F418 Other specified anxiety disorders: Secondary | ICD-10-CM

## 2023-03-19 ENCOUNTER — Other Ambulatory Visit (HOSPITAL_COMMUNITY): Payer: Self-pay | Admitting: Psychiatry

## 2023-03-19 DIAGNOSIS — H8109 Meniere's disease, unspecified ear: Secondary | ICD-10-CM

## 2023-03-21 ENCOUNTER — Encounter (HOSPITAL_COMMUNITY): Payer: Self-pay | Admitting: Psychiatry

## 2023-03-21 ENCOUNTER — Telehealth (HOSPITAL_BASED_OUTPATIENT_CLINIC_OR_DEPARTMENT_OTHER): Payer: 59 | Admitting: Psychiatry

## 2023-03-21 VITALS — Wt 195.0 lb

## 2023-03-21 DIAGNOSIS — F4310 Post-traumatic stress disorder, unspecified: Secondary | ICD-10-CM

## 2023-03-21 DIAGNOSIS — F418 Other specified anxiety disorders: Secondary | ICD-10-CM | POA: Diagnosis not present

## 2023-03-21 DIAGNOSIS — H8109 Meniere's disease, unspecified ear: Secondary | ICD-10-CM

## 2023-03-21 MED ORDER — LORAZEPAM 0.5 MG PO TABS: 0.5000 mg | ORAL_TABLET | Freq: Every day | ORAL | 2 refills | Status: DC | PRN

## 2023-03-21 MED ORDER — SERTRALINE HCL 100 MG PO TABS: 100.0000 mg | ORAL_TABLET | Freq: Every day | ORAL | 2 refills | Status: DC
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
## 2023-04-01 ENCOUNTER — Ambulatory Visit
Admission: RE | Admit: 2023-04-01 | Discharge: 2023-04-01 | Disposition: A | Discharge status: Home / Self Care | Payer: 59 | Source: Ambulatory Visit | Attending: Internal Medicine | Admitting: Internal Medicine

## 2023-04-01 ENCOUNTER — Encounter: Payer: Self-pay | Admitting: *Deleted

## 2023-04-01 DIAGNOSIS — E042 Nontoxic multinodular goiter: Secondary | ICD-10-CM | POA: Diagnosis not present

## 2023-04-01 DIAGNOSIS — E041 Nontoxic single thyroid nodule: Secondary | ICD-10-CM

## 2023-04-01 NOTE — Progress Notes (Signed)
[facility] Quality Team Note  Name: Angela Mcintyre Date of Birth: [birth year] MRN: [id number] Date: 04/01/2023  [facility] Quality Team has reviewed this patient's chart, please see recommendations below:  [facility] Quality Other; Pt has open gaps for colon screening and diabetic retinopathy screening.  Cologuard order on file from 09/13/22 and eye referral on file from 02/04/2023.  No record of completion found in Epic.  Pt has ov on 04/15/2023.  Would provider be able to address at ov?

## 2023-04-02 ENCOUNTER — Other Ambulatory Visit: Payer: Self-pay | Admitting: Nurse Practitioner

## 2023-04-02 DIAGNOSIS — I10 Essential (primary) hypertension: Secondary | ICD-10-CM

## 2023-04-03 ENCOUNTER — Other Ambulatory Visit: Payer: Self-pay | Admitting: Nurse Practitioner

## 2023-04-03 DIAGNOSIS — R7303 Prediabetes: Secondary | ICD-10-CM

## 2023-04-03 DIAGNOSIS — E119 Type 2 diabetes mellitus without complications: Secondary | ICD-10-CM

## 2023-04-03 DIAGNOSIS — Z0000 Encounter for general adult medical examination without abnormal findings: Secondary | ICD-10-CM

## 2023-04-03 DIAGNOSIS — I10 Essential (primary) hypertension: Secondary | ICD-10-CM

## 2023-04-08 ENCOUNTER — Other Ambulatory Visit: Payer: 59

## 2023-04-08 DIAGNOSIS — Z0000 Encounter for general adult medical examination without abnormal findings: Secondary | ICD-10-CM | POA: Diagnosis not present

## 2023-04-08 DIAGNOSIS — E119 Type 2 diabetes mellitus without complications: Secondary | ICD-10-CM

## 2023-04-08 DIAGNOSIS — I10 Essential (primary) hypertension: Secondary | ICD-10-CM | POA: Diagnosis not present

## 2023-04-09 LAB — CBC WITH DIFFERENTIAL/PLATELET
Basophils Absolute: 0.1 10*3/uL (ref 0.0–0.2)
Basos: 1 %
EOS (ABSOLUTE): 0.4 10*3/uL (ref 0.0–0.4)
Eos: 4 %
Hematocrit: 44.1 % (ref 34.0–46.6)
Hemoglobin: 14.5 g/dL (ref 11.1–15.9)
Immature Grans (Abs): 0.1 10*3/uL (ref 0.0–0.1)
Immature Granulocytes: 1 %
Lymphocytes Absolute: 2.6 10*3/uL (ref 0.7–3.1)
Lymphs: 28 %
MCH: 29.9 pg (ref 26.6–33.0)
MCHC: 32.9 g/dL (ref 31.5–35.7)
MCV: 91 fL (ref 79–97)
Monocytes Absolute: 0.5 10*3/uL (ref 0.1–0.9)
Monocytes: 6 %
Neutrophils Absolute: 5.6 10*3/uL (ref 1.4–7.0)
Neutrophils: 60 %
Platelets: 245 10*3/uL (ref 150–450)
RBC: 4.85 x10E6/uL (ref 3.77–5.28)
RDW: 13.4 % (ref 11.7–15.4)
WBC: 9.2 10*3/uL (ref 3.4–10.8)

## 2023-04-09 LAB — COMPREHENSIVE METABOLIC PANEL
ALT: 13 IU/L (ref 0–32)
AST: 15 IU/L (ref 0–40)
Albumin: 4.3 g/dL (ref 3.9–4.9)
Alkaline Phosphatase: 112 IU/L (ref 44–121)
BUN/Creatinine Ratio: 23 (ref 12–28)
BUN: 15 mg/dL (ref 8–27)
Bilirubin Total: 0.4 mg/dL (ref 0.0–1.2)
CO2: 25 mmol/L (ref 20–29)
Calcium: 9.4 mg/dL (ref 8.7–10.3)
Chloride: 97 mmol/L (ref 96–106)
Creatinine, Ser: 0.66 mg/dL (ref 0.57–1.00)
Globulin, Total: 2.3 g/dL (ref 1.5–4.5)
Glucose: 112 mg/dL — ABNORMAL HIGH (ref 70–99)
Potassium: 4.1 mmol/L (ref 3.5–5.2)
Sodium: 137 mmol/L (ref 134–144)
Total Protein: 6.6 g/dL (ref 6.0–8.5)
eGFR: 96 mL/min/{1.73_m2} (ref >59)

## 2023-04-09 LAB — TSH: TSH: 1.52 u[IU]/mL (ref 0.450–4.500)

## 2023-04-09 LAB — LIPID PANEL
Chol/HDL Ratio: 4.2 ratio (ref 0.0–4.4)
Cholesterol, Total: 204 mg/dL — ABNORMAL HIGH (ref 100–199)
HDL: 49 mg/dL (ref >39)
LDL Chol Calc (NIH): 133 mg/dL — ABNORMAL HIGH (ref 0–99)
Triglycerides: 125 mg/dL (ref 0–149)
VLDL Cholesterol Cal: 22 mg/dL (ref 5–40)

## 2023-04-09 LAB — HEMOGLOBIN A1C
Est. average glucose Bld gHb Est-mCnc: 128 mg/dL
Hgb A1c MFr Bld: 6.1 % — ABNORMAL HIGH (ref 4.8–5.6)

## 2023-04-15 ENCOUNTER — Ambulatory Visit: Payer: 59 | Admitting: Nurse Practitioner

## 2023-04-15 NOTE — Progress Notes (Signed)
Mild elevation of LDL and total cholesterol.  Hemoglobin A1c 6.1.  Other labs stable or normal.  Has visit with you 04/16/2023.

## 2023-04-16 ENCOUNTER — Ambulatory Visit (INDEPENDENT_AMBULATORY_CARE_PROVIDER_SITE_OTHER): Payer: 59 | Admitting: Family Medicine

## 2023-04-16 ENCOUNTER — Encounter: Payer: Self-pay | Admitting: Family Medicine

## 2023-04-16 VITALS — BP 123/82 | HR 60 | Resp 18 | Ht 64.0 in | Wt 197.0 lb

## 2023-04-16 DIAGNOSIS — E119 Type 2 diabetes mellitus without complications: Secondary | ICD-10-CM

## 2023-04-16 DIAGNOSIS — Z7985 Long-term (current) use of injectable non-insulin antidiabetic drugs: Secondary | ICD-10-CM

## 2023-04-16 DIAGNOSIS — E1159 Type 2 diabetes mellitus with other circulatory complications: Secondary | ICD-10-CM

## 2023-04-16 DIAGNOSIS — I152 Hypertension secondary to endocrine disorders: Secondary | ICD-10-CM

## 2023-04-16 LAB — POCT UA - MICROALBUMIN
Creatinine, POC: 50 mg/dL
Microalbumin Ur, POC: 10 mg/L

## 2023-04-16 MED ORDER — ROSUVASTATIN CALCIUM 5 MG PO TABS
5.0000 mg | ORAL_TABLET | Freq: Every day | ORAL | 2 refills | Status: DC
Start: 2023-04-16 — End: 2024-02-26

## 2023-04-16 NOTE — Progress Notes (Signed)
   Established Patient Office Visit  Subjective   Patient ID: Angela Mcintyre, female    DOB: [birth year]  Age: 67 y.o. MRN: [id number]  Chief Complaint  Patient presents with   Diabetes    HPI Angela Mcintyre is a 67 y.o. female presenting today for follow up of hypertension, diabetes. Hypertension:  Pt denies chest pain, SOB, dizziness, edema, syncope, fatigue or heart palpitations. Taking HCTZ and metoprolol, reports excellent compliance with treatment. Denies side effects. Diabetes: denies hypoglycemic events, wounds or sores that are not healing well, increased thirst or urination. Denies vision problems, eye exam scheduled for November 17. Checking glucose at home, ranges have been between 100 and 130 fasting. Taking Ozempic as prescribed without any side effects.   ROS Negative unless otherwise noted in HPI   Objective:     BP 123/82 (BP Location: Left Arm, Patient Position: Sitting, Cuff Size: Large)   Pulse 60   Resp 18   Ht 5\' 4"  (1.626 m)   Wt 197 lb (89.4 kg)   SpO2 95%   BMI 33.81 kg/m   Physical Exam Constitutional:      General: She is not in acute distress.    Appearance: Normal appearance.  HENT:     Head: Normocephalic and atraumatic.  Cardiovascular:     Rate and Rhythm: Normal rate and regular rhythm.     Heart sounds: No murmur heard.    No friction rub. No gallop.  Pulmonary:     Effort: Pulmonary effort is normal. No respiratory distress.     Breath sounds: No wheezing, rhonchi or rales.  Skin:    General: Skin is warm and dry.  Neurological:     Mental Status: She is alert and oriented to person, place, and time.    Results for orders placed or performed in visit on 04/16/23  POCT UA - Microalbumin  Result Value Ref Range   Microalbumin Ur, POC 10 mg/L   Creatinine, POC 50 mg/dL   Albumin/Creatinine Ratio, Urine, POC 30-300      Assessment & Plan:  Hypertension associated with diabetes (HCC) Assessment & Plan: BP goal <130/80,  essentially at goal in office.  Continue hydrochlorothiazide 25 mg daily, metoprolol 25 mg twice daily.  CMP within normal limits.  Will continue to monitor.   Type 2 diabetes mellitus without complication, without long-term current use of insulin (HCC) Assessment & Plan: A1c stable at 6.1.  Continue Ozempic 2 mg weekly.  We also discussed that LDL slightly elevated at 133, plus statins are recommended for anyone that has diabetes.  Patient is agreeable to starting low-dose statin.  Start rosuvastatin 5 mg every other day.  Will continue to monitor cholesterol levels as well. Urine albumin/creatinine ratio slightly elevated.  Will continue to monitor this.  Orders: -     Rosuvastatin Calcium; Take 1 tablet (5 mg total) by mouth daily.  Dispense: 30 tablet; Refill: 2 -     POCT UA - Microalbumin    Return in about 4 months (around 08/17/2023) for follow-up for HTN, DM, sciatica, fasting blood work 1 week before.    Melida Quitter, PA

## 2023-04-16 NOTE — Assessment & Plan Note (Signed)
BP goal <130/80, essentially at goal in office.  Continue hydrochlorothiazide 25 mg daily, metoprolol 25 mg twice daily.  CMP within normal limits.  Will continue to monitor.

## 2023-04-16 NOTE — Patient Instructions (Signed)
START rosuvastatin 5 mg every other day. If you develop side effects, please let me know!  Keep up the great work, I will see you in a few months.

## 2023-04-16 NOTE — Assessment & Plan Note (Signed)
A1c stable at 6.1.  Continue Ozempic 2 mg weekly.  We also discussed that LDL slightly elevated at 133, plus statins are recommended for anyone that has diabetes.  Patient is agreeable to starting low-dose statin.  Start rosuvastatin 5 mg every other day.  Will continue to monitor cholesterol levels as well. Urine albumin/creatinine ratio slightly elevated.  Will continue to monitor this.

## 2023-04-17 ENCOUNTER — Other Ambulatory Visit: Payer: Self-pay | Admitting: Nurse Practitioner

## 2023-04-17 DIAGNOSIS — G8929 Other chronic pain: Secondary | ICD-10-CM

## 2023-06-11 ENCOUNTER — Encounter: Payer: Self-pay | Admitting: Family Medicine

## 2023-06-11 ENCOUNTER — Ambulatory Visit (INDEPENDENT_AMBULATORY_CARE_PROVIDER_SITE_OTHER): Payer: 59 | Admitting: Family Medicine

## 2023-06-11 VITALS — BP 118/76 | HR 62 | Resp 18 | Ht 64.0 in | Wt 194.0 lb

## 2023-06-11 DIAGNOSIS — W57XXXA Bitten or stung by nonvenomous insect and other nonvenomous arthropods, initial encounter: Secondary | ICD-10-CM | POA: Diagnosis not present

## 2023-06-11 DIAGNOSIS — R238 Other skin changes: Secondary | ICD-10-CM | POA: Diagnosis not present

## 2023-06-11 DIAGNOSIS — L299 Pruritus, unspecified: Secondary | ICD-10-CM

## 2023-06-11 MED ORDER — CLOBETASOL PROPIONATE 0.05 % EX CREA
1.0000 | TOPICAL_CREAM | Freq: Two times a day (BID) | CUTANEOUS | 0 refills | Status: DC
Start: 2023-06-11 — End: 2023-08-19

## 2023-06-11 MED ORDER — MUPIROCIN 2 % EX OINT
TOPICAL_OINTMENT | CUTANEOUS | 3 refills | Status: DC
Start: 2023-06-11 — End: 2023-08-19

## 2023-06-11 NOTE — Patient Instructions (Signed)
#  1: PREVENT BUG BITES -use bug spray every time you go outside -wear long pants and long sleeve shirts when you go outside  #2: CONTROL ITCHING OF BUG BITES YOU DO GET -use a small amount of the steroid cream (clobetasol) on the itchy bites. Be careful not to apply it to open wounds/sores. -use cold water or ice -prevent scratching overnight if you can handle wearing something over your hands -keep fingernails as short as possible  #3: HEAL THE OPEN BUG BITES -use the mupirocin ointment three times each day for about a week to help your bug bites heal

## 2023-06-11 NOTE — Progress Notes (Signed)
   Acute Office Visit  Subjective:     Patient ID: Angela Mcintyre, female    DOB: 07/05/1956, 67 y.o.   MRN: [id number]  Chief Complaint  Patient presents with   Insect Bite    HPI Patient is in today for bug bites. She [place] acres and is often outside doing yardwork. Since moving here 2 years ago, she has struggled with mosquito bites whenever she goes outside, particularly in summer months. This summer has been especially bad, and she has received bites over her legs and arms that itch enough that she scratches them to the point of bleeding. OTC remedies have not controlled the itching. She finds that heat makes the itching worse. She also scratches her bites in her sleep. The open bites are very slow to heal, which is concerning to her.  Review of Systems  Constitutional:  Negative for chills, fever and malaise/fatigue.  Respiratory:  Negative for cough and shortness of breath.   Cardiovascular:  Negative for chest pain.  Musculoskeletal:  Negative for joint pain and myalgias.  Skin:  Positive for itching. Negative for rash.  Neurological:  Negative for dizziness and headaches.      Objective:    BP 118/76 (BP Location: Left Arm, Patient Position: Sitting, Cuff Size: Large)   Pulse 62   Resp 18   Ht 5\' 4"  (1.626 m)   Wt 194 lb (88 kg)   SpO2 93%   BMI 33.30 kg/m   Physical Exam Constitutional:      General: She is not in acute distress.    Appearance: Normal appearance.  HENT:     Head: Normocephalic and atraumatic.  Pulmonary:     Effort: Pulmonary effort is normal. No respiratory distress.  Musculoskeletal:     Cervical back: Normal range of motion.  Skin:    Comments: Erythematous itchy papules scattered diffusely over arms and legs bilaterally. Several dozen have scabbed over, and of those about half have surrounding erythema.  Neurological:     General: No focal deficit present.     Mental Status: She is alert and oriented to person, place, and time.  Mental status is at baseline.  Psychiatric:        Mood and Affect: Mood normal.        Thought Content: Thought content normal.        Judgment: Judgment normal.       Assessment & Plan:  Bug bite, initial encounter -     Clobetasol Propionate; Apply 1 Application topically 2 (two) times daily. Failed OTC treatment of hydrocortisone  Dispense: 45 g; Refill: 0 -     Mupirocin; Apply to affected area three times daily for 7 days.  Dispense: 30 g; Refill: 3  Discussed goals of preventing bug bites using bug spray and wearing long pants and long sleeves when outside.  Second goal is to control itching of any bug bites that she does receive, over-the-counter hydrocortisone cream has not been strong enough so sending prescription for clobetasol cream for itchy bites.  Third goal is to heal the open wounds that she has from scratching bug bites, some of which do look slightly infected, so sending mupirocin cream.  We also discussed that is important that she does not apply clobetasol cream over the open wound.  Patient verbalized understanding and is agreeable to this plan.  Return if symptoms worsen or fail to improve.  Melida Quitter, PA

## 2023-06-13 ENCOUNTER — Encounter: Payer: 59 | Admitting: Nurse Practitioner

## 2023-06-18 ENCOUNTER — Other Ambulatory Visit (HOSPITAL_COMMUNITY): Payer: Self-pay | Admitting: Psychiatry

## 2023-06-18 DIAGNOSIS — H8109 Meniere's disease, unspecified ear: Secondary | ICD-10-CM

## 2023-06-18 DIAGNOSIS — F4310 Post-traumatic stress disorder, unspecified: Secondary | ICD-10-CM

## 2023-06-18 DIAGNOSIS — F418 Other specified anxiety disorders: Secondary | ICD-10-CM

## 2023-06-19 ENCOUNTER — Other Ambulatory Visit (HOSPITAL_COMMUNITY): Payer: Self-pay

## 2023-06-19 DIAGNOSIS — F418 Other specified anxiety disorders: Secondary | ICD-10-CM

## 2023-06-19 DIAGNOSIS — H8109 Meniere's disease, unspecified ear: Secondary | ICD-10-CM

## 2023-06-19 DIAGNOSIS — F4310 Post-traumatic stress disorder, unspecified: Secondary | ICD-10-CM

## 2023-06-19 MED ORDER — LORAZEPAM 0.5 MG PO TABS
0.5000 mg | ORAL_TABLET | Freq: Every day | ORAL | 0 refills | Status: DC | PRN
Start: 2023-06-19 — End: 2023-07-23

## 2023-06-19 MED ORDER — SERTRALINE HCL 100 MG PO TABS
100.0000 mg | ORAL_TABLET | Freq: Every day | ORAL | 0 refills | Status: DC
Start: 2023-06-19 — End: 2023-06-21

## 2023-06-20 ENCOUNTER — Other Ambulatory Visit (HOSPITAL_COMMUNITY): Payer: Self-pay | Admitting: Psychiatry

## 2023-06-20 ENCOUNTER — Telehealth (HOSPITAL_COMMUNITY): Payer: 59 | Admitting: Psychiatry

## 2023-06-20 DIAGNOSIS — F418 Other specified anxiety disorders: Secondary | ICD-10-CM

## 2023-06-20 DIAGNOSIS — F4310 Post-traumatic stress disorder, unspecified: Secondary | ICD-10-CM

## 2023-06-21 ENCOUNTER — Other Ambulatory Visit (HOSPITAL_COMMUNITY): Payer: Self-pay

## 2023-06-21 DIAGNOSIS — F418 Other specified anxiety disorders: Secondary | ICD-10-CM

## 2023-06-21 DIAGNOSIS — F4310 Post-traumatic stress disorder, unspecified: Secondary | ICD-10-CM

## 2023-06-21 MED ORDER — SERTRALINE HCL 100 MG PO TABS: 100.0000 mg | ORAL_TABLET | Freq: Every day | ORAL | 0 refills | Status: DC

## 2023-06-30 IMAGING — MG MM DIGITAL SCREENING BILAT W/ TOMO AND CAD
6 of 12 series · 6 of 36 positions shown · non-contrast
Comparison: None.

ACR Breast Density Category a: The breast tissue is almost entirely
fatty.
COMPARISON: None.

ACR Breast Density Category a: The breast tissue is almost entirely
fatty.

Addendum:
CLINICAL DATA: Screening.

EXAM:
DIGITAL SCREENING BILATERAL MAMMOGRAM WITH TOMOSYNTHESIS AND CAD
TECHNIQUE: Bilateral screening digital craniocaudal and mediolateral oblique
mammograms were obtained. Bilateral screening digital breast
tomosynthesis was performed. The images were evaluated with
computer-aided detection.

[R MLO synth-2D]
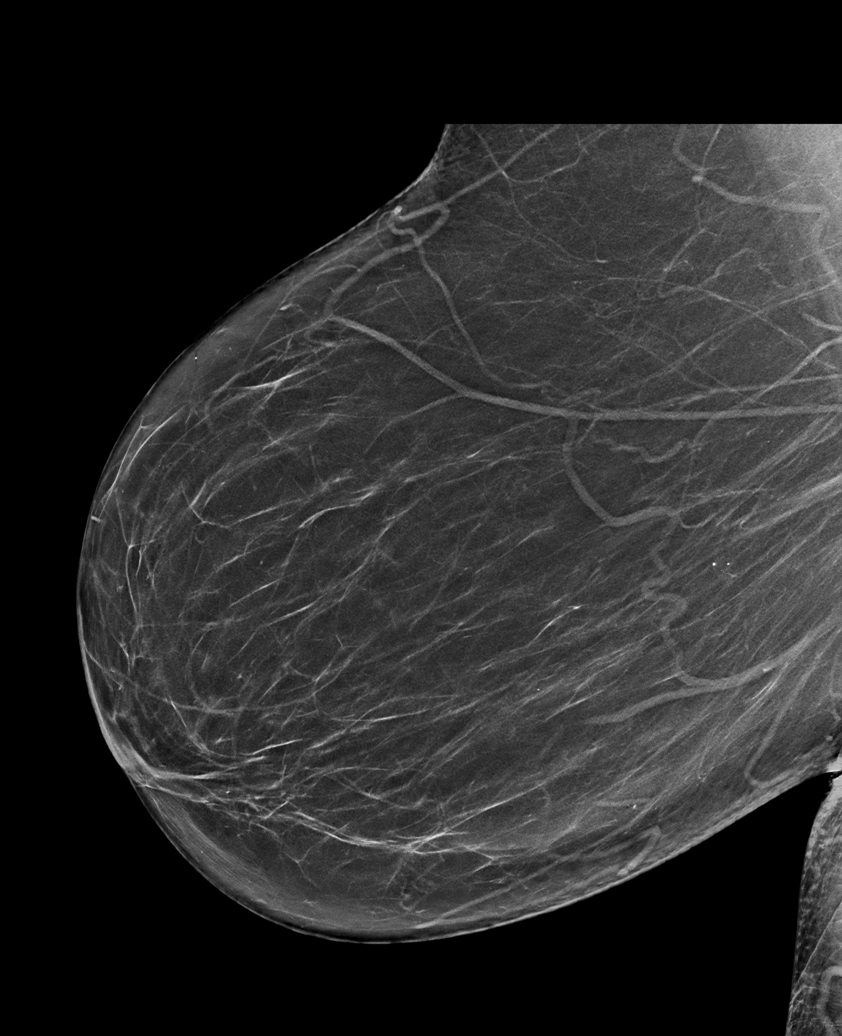

[L CC synth-2D (1 of 2)]
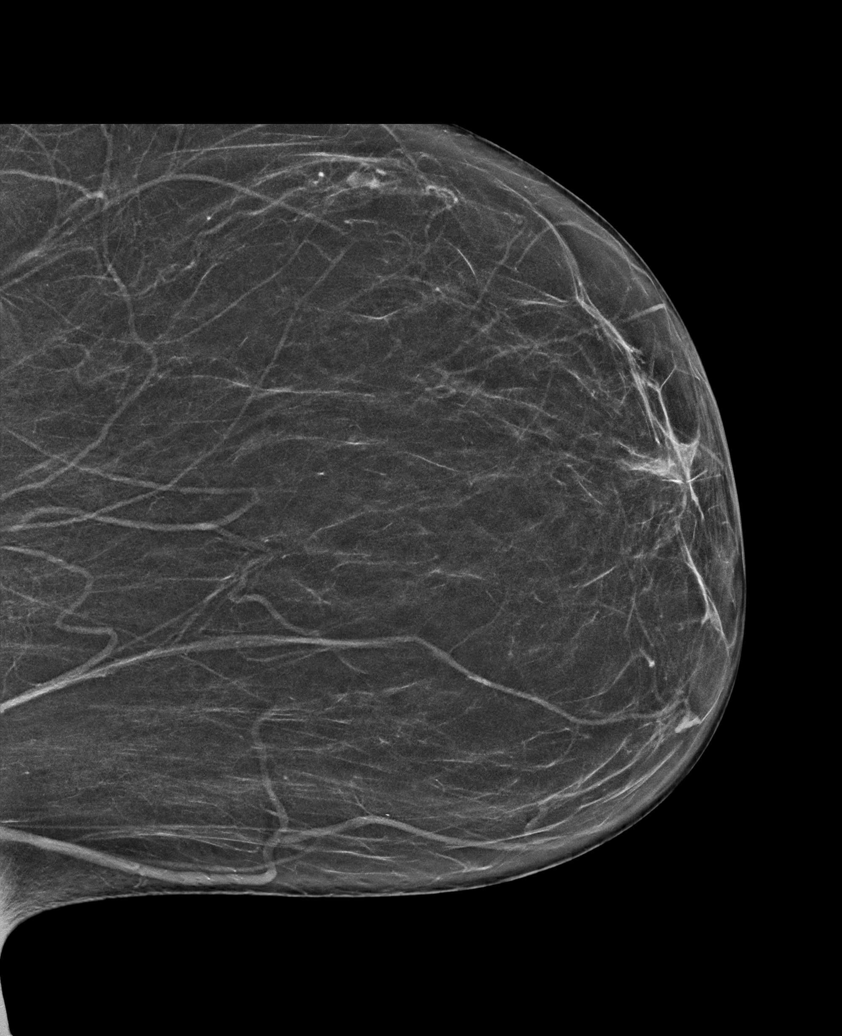

[L MLO synth-2D]
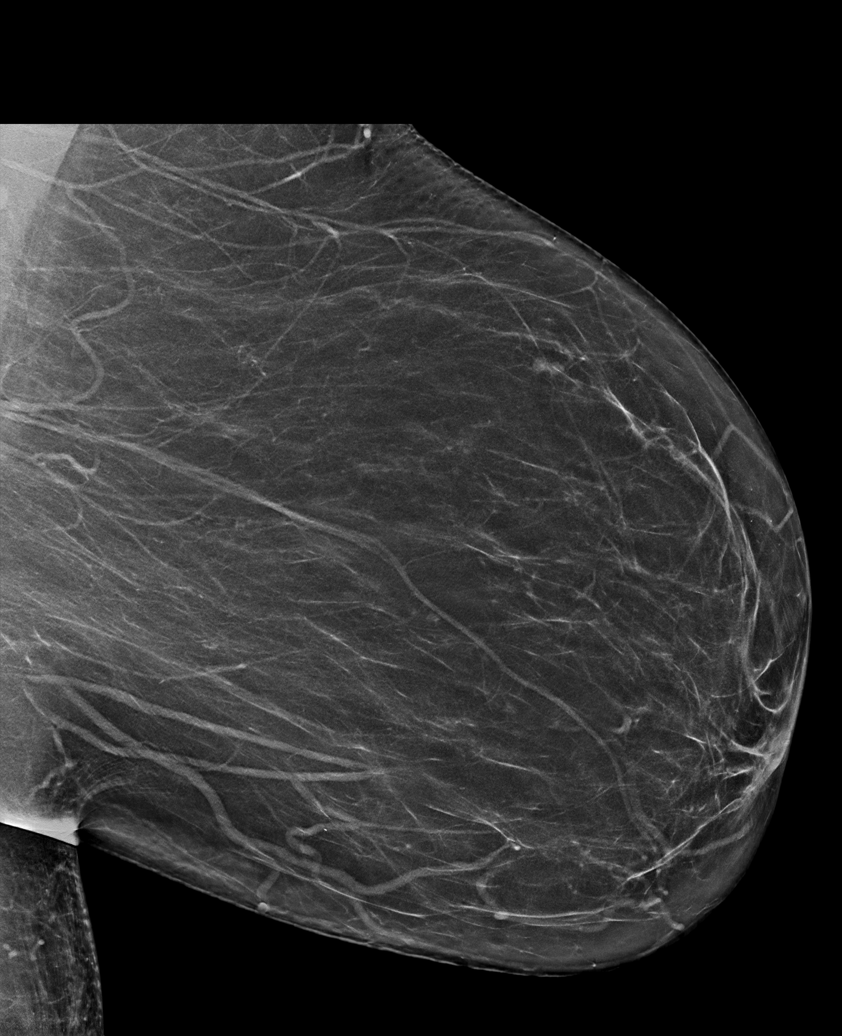

[L CC synth-2D (2 of 2)]
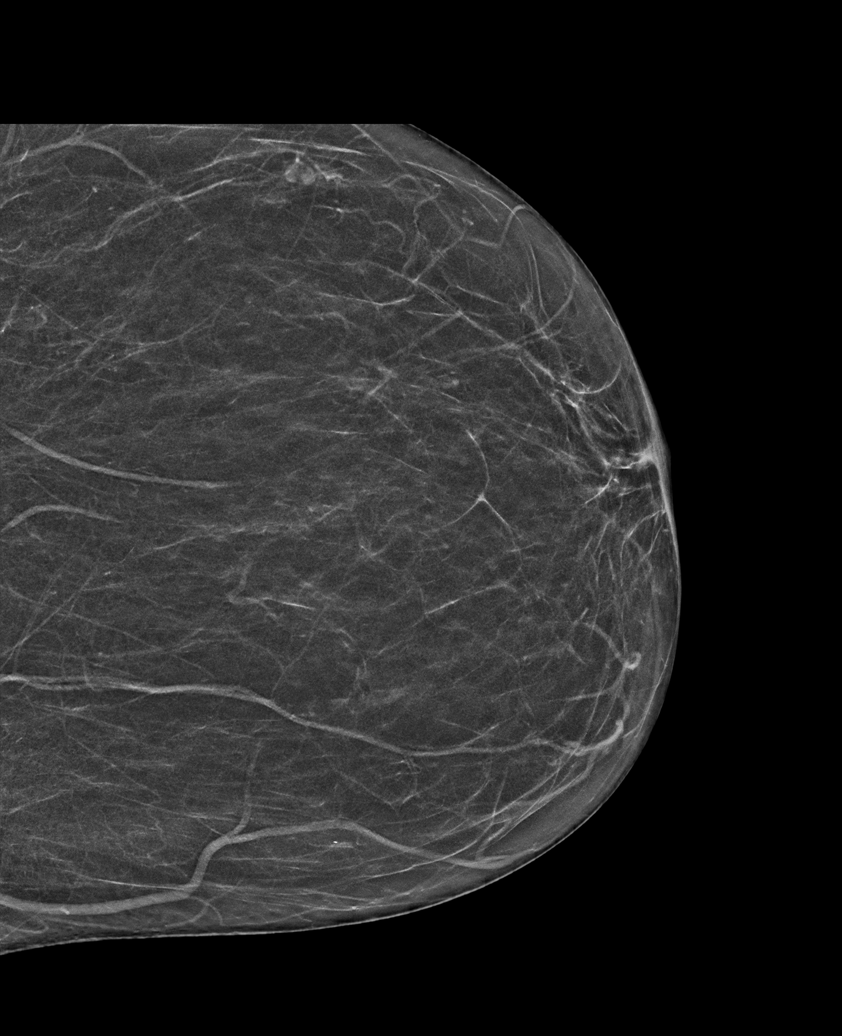

[R CC synth-2D (1 of 2)]
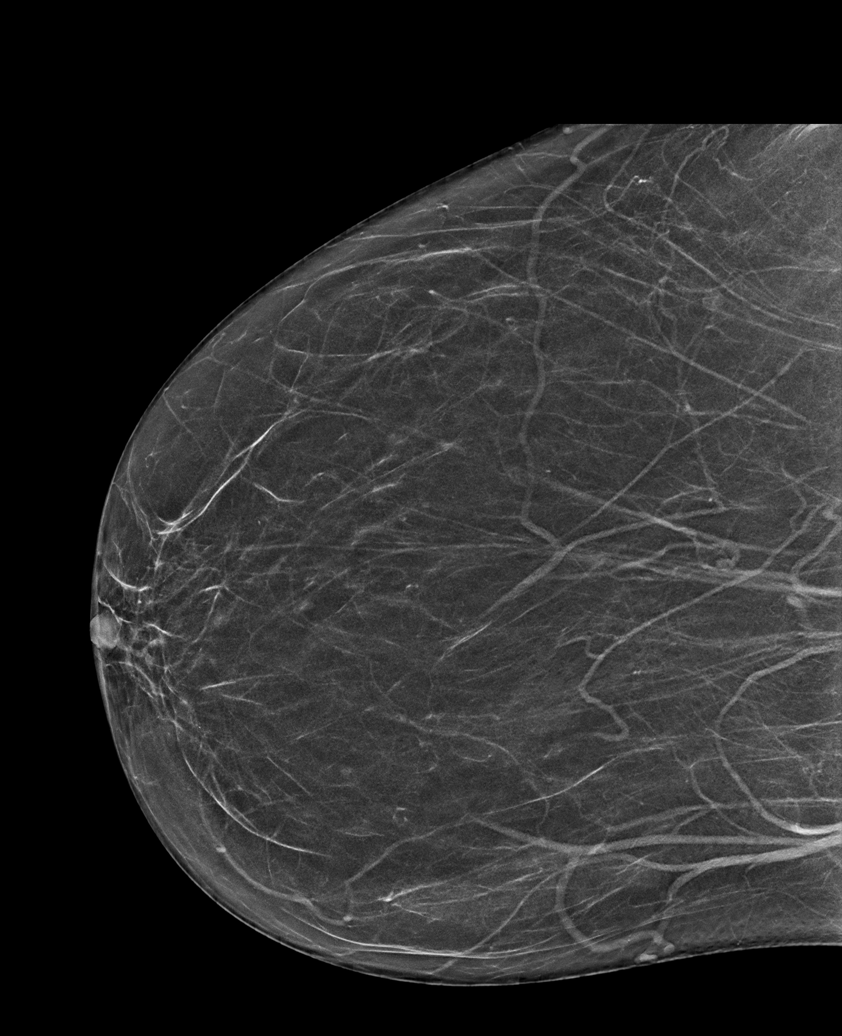

[R CC synth-2D (2 of 2)]
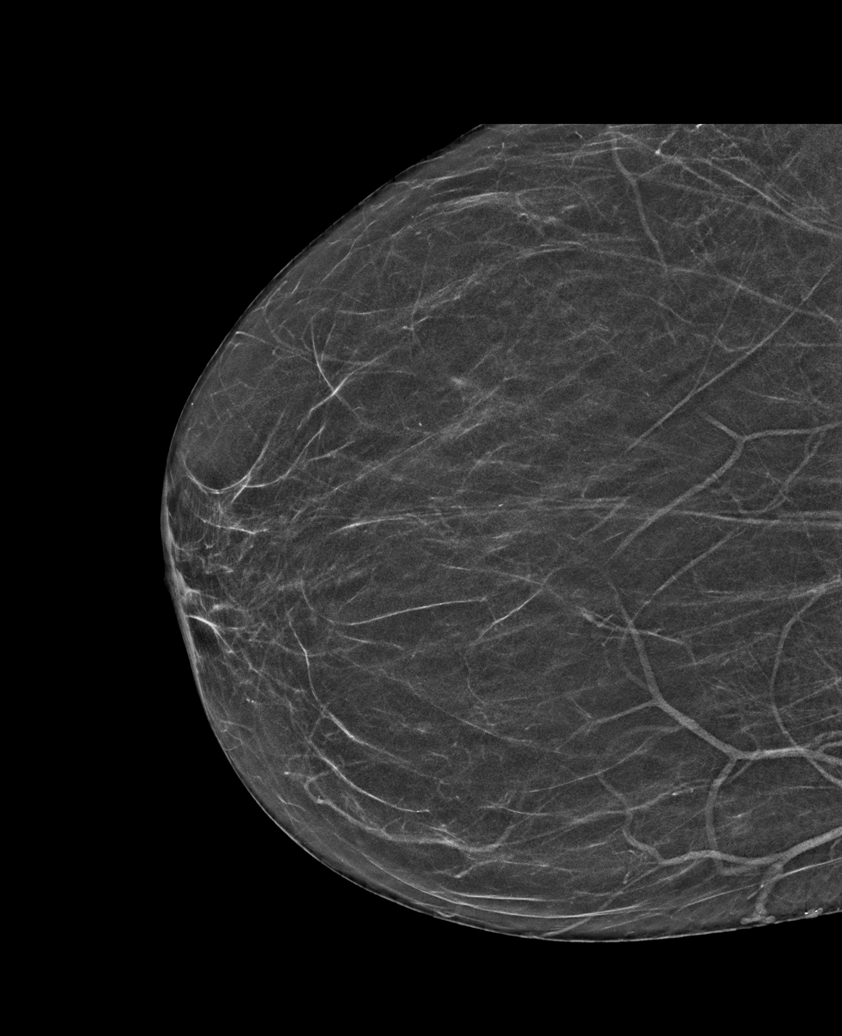

[6 of 36 positions shown; findings below may reference images not displayed]

FINDINGS: In the left breast, a possible mass warrants further evaluation. In
the right breast, no findings suspicious for malignancy.
IMPRESSION: Further evaluation is suggested for possible mass in the left
breast.

RECOMMENDATION:
Ultrasound of the left breast. (Code:9S-3-FF7)

The patient will be contacted regarding the findings, and additional
imaging will be scheduled.

BI-RADS CATEGORY  0: Incomplete. Need additional imaging evaluation
and/or prior mammograms for comparison.

ADDENDUM:
Patient's prior exams from 2872 and [id number] have become available for
direct comparison. The possible left breast mass on patient's recent
screening exam is unchanged from the prior exams as no further
workup is needed.
IMPRESSION: Stable mammogram.

RECOMMENDATIONS:
Recommend continued annual bilateral screening mammographic
follow-up.

ASSESSMENT: BI-RADS 1: Negative.

*** End of Addendum ***
FINDINGS: In the left breast, a possible mass warrants further evaluation. In
the right breast, no findings suspicious for malignancy.
IMPRESSION: Further evaluation is suggested for possible mass in the left
breast.

RECOMMENDATION:
Ultrasound of the left breast. (Code:9S-3-FF7)

The patient will be contacted regarding the findings, and additional
imaging will be scheduled.

BI-RADS CATEGORY  0: Incomplete. Need additional imaging evaluation
and/or prior mammograms for comparison.

## 2023-07-03 ENCOUNTER — Other Ambulatory Visit: Payer: Self-pay | Admitting: Family Medicine

## 2023-07-03 DIAGNOSIS — J449 Chronic obstructive pulmonary disease, unspecified: Secondary | ICD-10-CM

## 2023-07-10 ENCOUNTER — Other Ambulatory Visit: Payer: Self-pay | Admitting: Nurse Practitioner

## 2023-07-10 DIAGNOSIS — E1159 Type 2 diabetes mellitus with other circulatory complications: Secondary | ICD-10-CM

## 2023-07-10 NOTE — Telephone Encounter (Signed)
Hi there. This one is for you. :)

## 2023-07-19 ENCOUNTER — Other Ambulatory Visit (HOSPITAL_COMMUNITY): Payer: Self-pay | Admitting: Psychiatry

## 2023-07-19 DIAGNOSIS — F418 Other specified anxiety disorders: Secondary | ICD-10-CM

## 2023-07-19 DIAGNOSIS — F4310 Post-traumatic stress disorder, unspecified: Secondary | ICD-10-CM

## 2023-07-19 DIAGNOSIS — H8109 Meniere's disease, unspecified ear: Secondary | ICD-10-CM

## 2023-07-23 ENCOUNTER — Other Ambulatory Visit (HOSPITAL_COMMUNITY): Payer: Self-pay

## 2023-07-23 DIAGNOSIS — F4310 Post-traumatic stress disorder, unspecified: Secondary | ICD-10-CM

## 2023-07-23 DIAGNOSIS — F418 Other specified anxiety disorders: Secondary | ICD-10-CM

## 2023-07-23 DIAGNOSIS — H8109 Meniere's disease, unspecified ear: Secondary | ICD-10-CM

## 2023-07-23 MED ORDER — LORAZEPAM 0.5 MG PO TABS: 0.5000 mg | ORAL_TABLET | Freq: Every day | ORAL | 0 refills | Status: DC | PRN

## 2023-07-23 MED ORDER — SERTRALINE HCL 100 MG PO TABS: 100.0000 mg | ORAL_TABLET | Freq: Every day | ORAL | 0 refills | Status: DC

## 2023-07-30 ENCOUNTER — Other Ambulatory Visit: Payer: Self-pay | Admitting: Family Medicine

## 2023-07-30 ENCOUNTER — Telehealth (HOSPITAL_COMMUNITY): Payer: 59 | Admitting: Psychiatry

## 2023-07-30 DIAGNOSIS — E119 Type 2 diabetes mellitus without complications: Secondary | ICD-10-CM

## 2023-07-30 DIAGNOSIS — E782 Mixed hyperlipidemia: Secondary | ICD-10-CM

## 2023-07-30 DIAGNOSIS — E1159 Type 2 diabetes mellitus with other circulatory complications: Secondary | ICD-10-CM

## 2023-08-05 ENCOUNTER — Other Ambulatory Visit: Payer: Self-pay | Admitting: Family Medicine

## 2023-08-05 DIAGNOSIS — Z1159 Encounter for screening for other viral diseases: Secondary | ICD-10-CM

## 2023-08-06 ENCOUNTER — Telehealth: Payer: Self-pay

## 2023-08-06 NOTE — Telephone Encounter (Signed)
-----   Message from Melida Quitter sent at 08/05/2023  5:59 PM EDT ----- Please call to ask about eye exam.  I can place a referral to ophthalmology if he has not had a diabetic eye exam in the past year!

## 2023-08-06 NOTE — Telephone Encounter (Signed)
LVM requesting a return call in regards to finding out most recent eye exam.

## 2023-08-06 NOTE — Telephone Encounter (Signed)
Appt is 09/01/23.

## 2023-08-07 NOTE — Telephone Encounter (Signed)
Pt calling to let provider know that she has an eye appointment with [facility] pm 08/29/23@8 :30am.

## 2023-08-09 ENCOUNTER — Encounter (HOSPITAL_COMMUNITY): Payer: Self-pay | Admitting: Psychiatry

## 2023-08-09 ENCOUNTER — Other Ambulatory Visit (HOSPITAL_COMMUNITY): Payer: Self-pay

## 2023-08-09 ENCOUNTER — Telehealth (HOSPITAL_BASED_OUTPATIENT_CLINIC_OR_DEPARTMENT_OTHER): Payer: 59 | Admitting: Psychiatry

## 2023-08-09 VITALS — Wt 192.0 lb

## 2023-08-09 DIAGNOSIS — H8109 Meniere's disease, unspecified ear: Secondary | ICD-10-CM

## 2023-08-09 DIAGNOSIS — F418 Other specified anxiety disorders: Secondary | ICD-10-CM | POA: Diagnosis not present

## 2023-08-09 DIAGNOSIS — F4310 Post-traumatic stress disorder, unspecified: Secondary | ICD-10-CM

## 2023-08-09 MED ORDER — SERTRALINE HCL 100 MG PO TABS
100.0000 mg | ORAL_TABLET | Freq: Every day | ORAL | 2 refills | Status: DC
Start: 2023-08-09 — End: 2023-11-06

## 2023-08-09 MED ORDER — LORAZEPAM 0.5 MG PO TABS: 0.5000 mg | ORAL_TABLET | Freq: Every day | ORAL | 2 refills | Status: DC | PRN

## 2023-08-09 MED ORDER — LORAZEPAM 0.5 MG PO TABS: 0.5000 mg | ORAL_TABLET | Freq: Every day | ORAL | 2 refills | Status: DC
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
## 2023-08-12 ENCOUNTER — Other Ambulatory Visit: Payer: 59

## 2023-08-12 DIAGNOSIS — E1159 Type 2 diabetes mellitus with other circulatory complications: Secondary | ICD-10-CM | POA: Diagnosis not present

## 2023-08-12 DIAGNOSIS — I152 Hypertension secondary to endocrine disorders: Secondary | ICD-10-CM | POA: Diagnosis not present

## 2023-08-12 DIAGNOSIS — E782 Mixed hyperlipidemia: Secondary | ICD-10-CM | POA: Diagnosis not present

## 2023-08-12 DIAGNOSIS — E119 Type 2 diabetes mellitus without complications: Secondary | ICD-10-CM

## 2023-08-13 LAB — COMPREHENSIVE METABOLIC PANEL
ALT: 14 [IU]/L (ref 0–32)
AST: 19 [IU]/L (ref 0–40)
Albumin: 4.4 g/dL (ref 3.9–4.9)
Alkaline Phosphatase: 103 [IU]/L (ref 44–121)
BUN/Creatinine Ratio: 18 (ref 12–28)
BUN: 13 mg/dL (ref 8–27)
Bilirubin Total: 0.5 mg/dL (ref 0.0–1.2)
CO2: 24 mmol/L (ref 20–29)
Calcium: 9.5 mg/dL (ref 8.7–10.3)
Chloride: 96 mmol/L (ref 96–106)
Creatinine, Ser: 0.71 mg/dL (ref 0.57–1.00)
Globulin, Total: 3 g/dL (ref 1.5–4.5)
Glucose: 103 mg/dL — ABNORMAL HIGH (ref 70–99)
Potassium: 3.9 mmol/L (ref 3.5–5.2)
Sodium: 140 mmol/L (ref 134–144)
Total Protein: 7.4 g/dL (ref 6.0–8.5)
eGFR: 93 mL/min/{1.73_m2} (ref >59)

## 2023-08-13 LAB — LIPID PANEL
Chol/HDL Ratio: 3.7 ratio (ref 0.0–4.4)
Cholesterol, Total: 194 mg/dL (ref 100–199)
HDL: 53 mg/dL (ref >39)
LDL Chol Calc (NIH): 125 mg/dL — ABNORMAL HIGH (ref 0–99)
Triglycerides: 89 mg/dL (ref 0–149)
VLDL Cholesterol Cal: 16 mg/dL (ref 5–40)

## 2023-08-13 LAB — HEMOGLOBIN A1C
Est. average glucose Bld gHb Est-mCnc: 126 mg/dL
Hgb A1c MFr Bld: 6 % — ABNORMAL HIGH (ref 4.8–5.6)

## 2023-08-15 ENCOUNTER — Other Ambulatory Visit: Payer: Self-pay | Admitting: Family Medicine

## 2023-08-15 ENCOUNTER — Other Ambulatory Visit: Payer: Self-pay | Admitting: Nurse Practitioner

## 2023-08-15 DIAGNOSIS — G8929 Other chronic pain: Secondary | ICD-10-CM

## 2023-08-15 DIAGNOSIS — I10 Essential (primary) hypertension: Secondary | ICD-10-CM

## 2023-08-16 MED ORDER — HYDROCHLOROTHIAZIDE 25 MG PO TABS
25.0000 mg | ORAL_TABLET | Freq: Every day | ORAL | 3 refills | Status: DC
Start: 2023-08-16 — End: 2023-12-30

## 2023-08-19 ENCOUNTER — Ambulatory Visit (INDEPENDENT_AMBULATORY_CARE_PROVIDER_SITE_OTHER): Payer: 59 | Admitting: Family Medicine

## 2023-08-19 ENCOUNTER — Encounter: Payer: Self-pay | Admitting: Family Medicine

## 2023-08-19 VITALS — BP 111/73 | HR 62 | Resp 20 | Ht 64.0 in | Wt 195.0 lb

## 2023-08-19 DIAGNOSIS — Z6837 Body mass index (BMI) 37.0-37.9, adult: Secondary | ICD-10-CM

## 2023-08-19 DIAGNOSIS — J449 Chronic obstructive pulmonary disease, unspecified: Secondary | ICD-10-CM | POA: Diagnosis not present

## 2023-08-19 DIAGNOSIS — I152 Hypertension secondary to endocrine disorders: Secondary | ICD-10-CM | POA: Diagnosis not present

## 2023-08-19 DIAGNOSIS — J3089 Other allergic rhinitis: Secondary | ICD-10-CM | POA: Diagnosis not present

## 2023-08-19 DIAGNOSIS — Z1211 Encounter for screening for malignant neoplasm of colon: Secondary | ICD-10-CM

## 2023-08-19 DIAGNOSIS — E1159 Type 2 diabetes mellitus with other circulatory complications: Secondary | ICD-10-CM | POA: Diagnosis not present

## 2023-08-19 DIAGNOSIS — E119 Type 2 diabetes mellitus without complications: Secondary | ICD-10-CM

## 2023-08-19 MED ORDER — ALBUTEROL SULFATE HFA 108 (90 BASE) MCG/ACT IN AERS: 2.0000 | INHALATION_SPRAY | Freq: Four times a day (QID) | RESPIRATORY_TRACT | 11 refills | Status: AC | PRN

## 2023-08-19 MED ORDER — MONTELUKAST SODIUM 10 MG PO TABS: 10.0000 mg | ORAL_TABLET | Freq: Every day | ORAL | 0 refills | Status: DC

## 2023-08-19 NOTE — Assessment & Plan Note (Addendum)
BP goal <130/80, at goal in office.  Continue hydrochlorothiazide 25 mg daily, metoprolol 25 mg twice daily.  CMP within normal limits.  Will continue to monitor.

## 2023-08-19 NOTE — Patient Instructions (Addendum)
I want to make sure that you are using your Trelegy inhaler correctly.  I included instructions with pictures at the end of your packet, you should still be breathing in the medicine to ensure that it gets into your lungs in order to be helpful.  Use the albuterol inhaler only as needed/for rescue.  Talk to your endocrinologist about potentially increasing your dose of Ozempic to help with further weight loss efforts if that is your goal.  Weight and BMI are not the only indicators of overall or metabolic health, and you have already made amazing progress over the past year! [id number]  Start montelukast once daily until your follow-up appointment for allergies.

## 2023-08-19 NOTE — Progress Notes (Signed)
Established Patient Office Visit  Subjective   Patient ID: Angela Mcintyre, female    DOB: [birth year]  Age: 67 y.o. MRN: [id number]  Chief Complaint  Patient presents with   Diabetes   Hypertension   Sciatica   COPD    HPI Angela Mcintyre is a 67 y.o. female presenting today for follow up of hypertension, diabetes, COPD.  She also expresses frustration with recent plateau in her weight management.  She has not been able to move past the 192-195 pound weight point. Hypertension:  Pt denies chest pain, SOB, dizziness, edema, syncope, fatigue or heart palpitations. Taking hydrochlorothiazide and metoprolol tartrate, reports excellent compliance with treatment. Denies side effects. Diabetes: Followed and managed by endocrinology.  Denies hypoglycemic events, wounds or sores that are not healing well, increased thirst or urination. Taking Ozempic 0.5 mg weekly as prescribed without any side effects.  COPD: Patient previously tried and failed Breztri, reported that it did not work to control her symptoms.  Switched to [employer] inhaler daily as maintenance medication.  Patient has not noticed significant improvement, reports that she has not been inhaling the medication as she was instructed.  She is not sure who gave her these instructions.  She finds that she still gets winded going up the stairs, she has an albuterol nebulizer but does not have anything that she can take with her when she leaves the house in case she needs rescue medication.  She also notes that she has significant allergies and the changing of the seasons currently may be contributing to her COPD symptoms.   Outpatient Medications Prior to Visit  Medication Sig   albuterol (PROVENTIL) (2.5 MG/3ML) 0.083% nebulizer solution Use 1 vial nebulized every 6 hours as needed for shortness of breath   cyclobenzaprine (FLEXERIL) 10 MG tablet TAKE 1 TABLET BY MOUTH AT BEDTIME AS NEEDED FOR MUSCLE PAIN/ SPASMS   Glucose Blood (RELION  TRUE METRIX TEST STRIPS VI) 1 Stick by In Vitro route in the morning, at noon, and at bedtime.   glucose blood (RELION TRUE METRIX TEST STRIPS) test strip Test blood sugar 3 times daily   hydrochlorothiazide (HYDRODIURIL) 25 MG tablet Take 1 tablet (25 mg total) by mouth daily.   LORazepam (ATIVAN) 0.5 MG tablet Take 1 tablet (0.5 mg total) by mouth at bedtime.   metoprolol tartrate (LOPRESSOR) 25 MG tablet TAKE 1 TABLET BY MOUTH 2 TIMES DAILY WITH A MEAL.   rosuvastatin (CRESTOR) 5 MG tablet Take 1 tablet (5 mg total) by mouth daily.   Semaglutide,0.25 or 0.5MG /DOS, (OZEMPIC, 0.25 OR 0.5 MG/DOSE,) 2 MG/3ML SOPN Inject 0.5 mg into the skin once a week.   sertraline (ZOLOFT) 100 MG tablet Take 1 tablet (100 mg total) by mouth daily. Bridge to appt. On 10/25   TRELEGY ELLIPTA 100-62.5-25 MCG/ACT AEPB INHALE 1 PUFF INTO THE LUNGS DAILY. DISCONTINUE BREZTRI AS THIS HAS NOT BEEN EFFECTIVE.   [DISCONTINUED] clobetasol cream (TEMOVATE) 0.05 % Apply 1 Application topically 2 (two) times daily. Failed OTC treatment of hydrocortisone   [DISCONTINUED] mupirocin ointment (BACTROBAN) 2 % Apply to affected area three times daily for 7 days.   No facility-administered medications prior to visit.    ROS Negative unless otherwise noted in HPI   Objective:     BP 111/73 (BP Location: Left Arm, Patient Position: Sitting, Cuff Size: Normal)   Pulse 62   Resp 20   Ht 5\' 4"  (1.626 m)   Wt 195 lb (88.5 kg)   SpO2  97%   BMI 33.47 kg/m   Physical Exam Constitutional:      General: She is not in acute distress.    Appearance: Normal appearance.  HENT:     Head: Normocephalic and atraumatic.  Cardiovascular:     Rate and Rhythm: Normal rate and regular rhythm.     Heart sounds: No murmur heard.    No friction rub. No gallop.  Pulmonary:     Effort: Pulmonary effort is normal. No respiratory distress.     Breath sounds: No wheezing, rhonchi or rales.  Skin:    General: Skin is warm and dry.   Neurological:     Mental Status: She is alert and oriented to person, place, and time.     Assessment & Plan:  Hypertension associated with diabetes (HCC) Assessment & Plan: BP goal <130/80, at goal in office.  Continue hydrochlorothiazide 25 mg daily, metoprolol 25 mg twice daily.  CMP within normal limits.  Will continue to monitor.   Type 2 diabetes mellitus without complication, without long-term current use of insulin (HCC) Assessment & Plan: A1c is fantastic at 6.0.  Encouraged her to discuss increasing medication with her endocrinologist to optimize weight management, she is currently at 0.5 mg weekly.  Patient states that she needs to call to make a follow-up appointment with endocrinology anyway and will do this in the next couple of months.   Body mass index (BMI) of 37.0-37.9 in adult Assessment & Plan: Discussed that weight loss since 05/03/2022 with starting weight 228 pounds has been a total of 33 pounds, which is 14.7% of her starting body weight.  Discussed that this provides incredible benefits for metabolic health, congratulated her on the process thus far.  Discussed optimizing Ozempic if endocrinology is agreeable to increasing the dose.  Also discussed adjusting nutrition to change composition of protein, fiber, healthy fats, carbohydrates to move past plateau.   Chronic obstructive pulmonary disease, unspecified COPD type (HCC) Assessment & Plan: Provided instructions including printed instructions on proper use of Trelegy inhaler and how to breathe in to ensure that the medicine reaches the lungs.  Continue maintenance therapy for now given weather changes and possible allergies.  Start montelukast 10 mg daily for allergies as they may be a trigger of worsening COPD symptoms.  In the future, if still uncontrolled may consider referral to pulmonology or retrial of Breztri with proper instructions on how to use.  Also prescribing albuterol rescue inhaler to put in her  purse for when she is not home.  Orders: -     Albuterol Sulfate HFA; Inhale 2 puffs into the lungs every 6 (six) hours as needed for wheezing.  Dispense: 2 each; Refill: 11  Environmental and seasonal allergies -     Montelukast Sodium; Take 1 tablet (10 mg total) by mouth at bedtime.  Dispense: 90 tablet; Refill: 0  Screening for colorectal cancer -     Cologuard    Return in about 2 months (around 10/19/2023) for follow-up for COPD.    Melida Quitter, PA

## 2023-08-19 NOTE — Assessment & Plan Note (Signed)
Discussed that weight loss since 05/03/2022 with starting weight 228 pounds has been a total of 33 pounds, which is 14.7% of her starting body weight.  Discussed that this provides incredible benefits for metabolic health, congratulated her on the process thus far.  Discussed optimizing Ozempic if endocrinology is agreeable to increasing the dose.  Also discussed adjusting nutrition to change composition of protein, fiber, healthy fats, carbohydrates to move past plateau.

## 2023-08-19 NOTE — Assessment & Plan Note (Signed)
Provided instructions including printed instructions on proper use of Trelegy inhaler and how to breathe in to ensure that the medicine reaches the lungs.  Continue maintenance therapy for now given weather changes and possible allergies.  Start montelukast 10 mg daily for allergies as they may be a trigger of worsening COPD symptoms.  In the future, if still uncontrolled may consider referral to pulmonology or retrial of Breztri with proper instructions on how to use.  Also prescribing albuterol rescue inhaler to put in her purse for when she is not home.

## 2023-08-19 NOTE — Assessment & Plan Note (Signed)
A1c is fantastic at 6.0.  Encouraged her to discuss increasing medication with her endocrinologist to optimize weight management, she is currently at 0.5 mg weekly.  Patient states that she needs to call to make a follow-up appointment with endocrinology anyway and will do this in the next couple of months.

## 2023-08-29 ENCOUNTER — Encounter: Payer: Self-pay | Admitting: Internal Medicine

## 2023-08-29 DIAGNOSIS — H02831 Dermatochalasis of right upper eyelid: Secondary | ICD-10-CM | POA: Diagnosis not present

## 2023-08-29 DIAGNOSIS — H35033 Hypertensive retinopathy, bilateral: Secondary | ICD-10-CM | POA: Diagnosis not present

## 2023-08-29 DIAGNOSIS — H25813 Combined forms of age-related cataract, bilateral: Secondary | ICD-10-CM | POA: Diagnosis not present

## 2023-08-29 DIAGNOSIS — H04123 Dry eye syndrome of bilateral lacrimal glands: Secondary | ICD-10-CM | POA: Diagnosis not present

## 2023-08-29 DIAGNOSIS — H02834 Dermatochalasis of left upper eyelid: Secondary | ICD-10-CM | POA: Diagnosis not present

## 2023-08-29 DIAGNOSIS — E119 Type 2 diabetes mellitus without complications: Secondary | ICD-10-CM | POA: Diagnosis not present

## 2023-08-29 LAB — HM DIABETES EYE EXAM

## 2023-09-20 NOTE — Telephone Encounter (Signed)
HM up to date

## 2023-10-22 ENCOUNTER — Ambulatory Visit (INDEPENDENT_AMBULATORY_CARE_PROVIDER_SITE_OTHER): Payer: 59 | Admitting: Family Medicine

## 2023-10-22 ENCOUNTER — Encounter: Payer: Self-pay | Admitting: Family Medicine

## 2023-10-22 VITALS — BP 138/82 | HR 70 | Temp 99.2°F | Ht 64.0 in | Wt 194.1 lb

## 2023-10-22 DIAGNOSIS — E119 Type 2 diabetes mellitus without complications: Secondary | ICD-10-CM

## 2023-10-22 DIAGNOSIS — H6012 Cellulitis of left external ear: Secondary | ICD-10-CM | POA: Diagnosis not present

## 2023-10-22 DIAGNOSIS — J449 Chronic obstructive pulmonary disease, unspecified: Secondary | ICD-10-CM

## 2023-10-22 DIAGNOSIS — M79645 Pain in left finger(s): Secondary | ICD-10-CM

## 2023-10-22 MED ORDER — TRELEGY ELLIPTA 100-62.5-25 MCG/ACT IN AEPB: 1.0000 | INHALATION_SPRAY | Freq: Every day | RESPIRATORY_TRACT | 11 refills | Status: AC

## 2023-10-22 MED ORDER — SEMAGLUTIDE (1 MG/DOSE) 4 MG/3ML ~~LOC~~ SOPN: 1.0000 mg | PEN_INJECTOR | SUBCUTANEOUS | 1 refills | Status: DC

## 2023-10-22 NOTE — Assessment & Plan Note (Signed)
 Increase Ozempic to 1 mg weekly, continue working with endocrinology as well for weight management and glucose control.

## 2023-10-22 NOTE — Assessment & Plan Note (Signed)
 Lungs clear to auscultation bilaterally.  Continue Trelegy inhaler, reminded to use 1 puff once daily.  In the future, if uncontrolled may consider referral to pulmonology or retrial of Breztri  with proper instructions on how to use.  Will continue to monitor.

## 2023-10-22 NOTE — Progress Notes (Signed)
 Established Patient Office Visit  Subjective   Patient ID: Angela Mcintyre, female    DOB: 10-24-1955  Age: 68 y.o. MRN: [id number]  Chief Complaint  Patient presents with   Follow Up COPD    HPI Angela Mcintyre is a 68 y.o. female presenting today for follow up of COPD.  Last visit, started montelukast  10 mg for allergies.  Continued Trelegy inhaler and provided instructions on proper use at the time.  She stopped taking the montelukast  because it made her sick.  She has been using the Trelegy inhaler and finds that it has been helpful.  She still has good days and bad days with shortness breath going up the stairs but overall feels that she is experienced some improvement.  She is very frustrated with her weight being stagnant and spite of staying active, being mindful of nutrition and staying hydrated.  She would be interested in increasing Ozempic  to see if it will result in any weight loss.  She notes that over Christmas she hit her left hand into a wall and experienced some pain and swelling of her left third digit.  She still has some soreness in the PIP joint of this finger.  It is improving gradually.  She has also been having trouble sleeping because of pain along the curvature of her left ear that makes it difficult to lay on her left side.  Denies known trauma to the area.  Outpatient Medications Prior to Visit  Medication Sig   albuterol  (PROVENTIL ) (2.5 MG/3ML) 0.083% nebulizer solution Use 1 vial nebulized every 6 hours as needed for shortness of breath   albuterol  (VENTOLIN  HFA) 108 (90 Base) MCG/ACT inhaler Inhale 2 puffs into the lungs every 6 (six) hours as needed for wheezing.   cyclobenzaprine  (FLEXERIL ) 10 MG tablet TAKE 1 TABLET BY MOUTH AT BEDTIME AS NEEDED FOR MUSCLE PAIN/ SPASMS   Glucose Blood (RELION TRUE METRIX TEST STRIPS VI) 1 Stick by In Vitro route in the morning, at noon, and at bedtime.   glucose blood (RELION TRUE METRIX TEST STRIPS) test strip Test blood sugar  3 times daily   hydrochlorothiazide  (HYDRODIURIL ) 25 MG tablet Take 1 tablet (25 mg total) by mouth daily.   LORazepam  (ATIVAN ) 0.5 MG tablet Take 1 tablet (0.5 mg total) by mouth at bedtime.   metoprolol  tartrate (LOPRESSOR ) 25 MG tablet TAKE 1 TABLET BY MOUTH 2 TIMES DAILY WITH A MEAL.   rosuvastatin  (CRESTOR ) 5 MG tablet Take 1 tablet (5 mg total) by mouth daily.   sertraline  (ZOLOFT ) 100 MG tablet Take 1 tablet (100 mg total) by mouth daily. Bridge to appt. On 10/25   [DISCONTINUED] Semaglutide ,0.25 or 0.5MG /DOS, (OZEMPIC , 0.25 OR 0.5 MG/DOSE,) 2 MG/3ML SOPN Inject 0.5 mg into the skin once a week.   [DISCONTINUED] TRELEGY ELLIPTA  100-62.5-25 MCG/ACT AEPB INHALE 1 PUFF INTO THE LUNGS DAILY. DISCONTINUE BREZTRI  AS THIS HAS NOT BEEN EFFECTIVE.   [DISCONTINUED] montelukast  (SINGULAIR ) 10 MG tablet Take 1 tablet (10 mg total) by mouth at bedtime.   No facility-administered medications prior to visit.    ROS Negative unless otherwise noted in HPI   Objective:     BP 138/82   Pulse 70   Temp 99.2 F (37.3 C) (Oral)   Ht 5' 4 (1.626 m)   Wt 194 lb 1.3 oz (88 kg)   SpO2 96%   BMI 33.31 kg/m   Physical Exam Constitutional:      General: She is not in acute distress.  Appearance: Normal appearance.  HENT:     Head: Normocephalic and atraumatic.     Right Ear: External ear normal.     Ears:      Comments: Circular raised area with erythema and tenderness Cardiovascular:     Rate and Rhythm: Normal rate and regular rhythm.     Heart sounds: No murmur heard.    No friction rub. No gallop.  Pulmonary:     Effort: Pulmonary effort is normal. No respiratory distress.     Breath sounds: No wheezing, rhonchi or rales.  Musculoskeletal:     Right hand: Normal.     Left hand: Tenderness present. No swelling or deformity. Decreased range of motion.     Comments: Tenderness and decreased ROM of PIP joint of third digit  Skin:    General: Skin is warm and dry.  Neurological:      Mental Status: She is alert and oriented to person, place, and time.      Assessment & Plan:  Type 2 diabetes mellitus without complication, without long-term current use of insulin (HCC) Assessment & Plan: Increase Ozempic  to 1 mg weekly, continue working with endocrinology as well for weight management and glucose control.  Orders: -     Semaglutide  (1 MG/DOSE); Inject 1 mg as directed once a week.  Dispense: 9 mL; Refill: 1  Chronic obstructive pulmonary disease, unspecified COPD type (HCC) Assessment & Plan: Lungs clear to auscultation bilaterally.  Continue Trelegy inhaler, reminded to use 1 puff once daily.  In the future, if uncontrolled may consider referral to pulmonology or retrial of Breztri  with proper instructions on how to use.  Will continue to monitor.  Orders: -     Trelegy Ellipta ; Inhale 1 puff into the lungs daily.  Dispense: 60 each; Refill: 11  Pain of left middle finger  Cellulitis of left external ear  Encouraged to continue with conservative measures for another month regarding pain of left middle finger.  Reassuring that pain and ROM have been improving over time.  Presentation consistent with infection of bothersome area of the left ear.  Recommended to keep the area clean with mild soap and water.  Provided samples of topical antibiotic to use for 1 week.  Return in about 3 months (around 01/20/2024) for follow-up for HTN, HLD, DM, COPD, fasting blood work 1 week before.    Joesph DELENA Sear, PA

## 2023-10-22 NOTE — Patient Instructions (Signed)
 We will increase your dose of Ozempic!  Stay at this dose until your next follow-up appointment, and if we need to increase at that point there is 1 more step up.  Continue using your Trelegy inhaler, ONE puff once daily.

## 2023-11-04 ENCOUNTER — Telehealth: Payer: 59

## 2023-11-05 ENCOUNTER — Encounter: Payer: Self-pay | Admitting: Family Medicine

## 2023-11-05 ENCOUNTER — Ambulatory Visit: Payer: Self-pay | Admitting: Family Medicine

## 2023-11-05 ENCOUNTER — Ambulatory Visit (INDEPENDENT_AMBULATORY_CARE_PROVIDER_SITE_OTHER): Payer: 59 | Admitting: Family Medicine

## 2023-11-05 VITALS — BP 132/88 | HR 116 | Ht 64.0 in | Wt 183.0 lb

## 2023-11-05 DIAGNOSIS — F4310 Post-traumatic stress disorder, unspecified: Secondary | ICD-10-CM

## 2023-11-05 DIAGNOSIS — E119 Type 2 diabetes mellitus without complications: Secondary | ICD-10-CM | POA: Diagnosis not present

## 2023-11-05 DIAGNOSIS — Z7985 Long-term (current) use of injectable non-insulin antidiabetic drugs: Secondary | ICD-10-CM

## 2023-11-05 DIAGNOSIS — R197 Diarrhea, unspecified: Secondary | ICD-10-CM

## 2023-11-05 DIAGNOSIS — R112 Nausea with vomiting, unspecified: Secondary | ICD-10-CM | POA: Diagnosis not present

## 2023-11-05 DIAGNOSIS — H8109 Meniere's disease, unspecified ear: Secondary | ICD-10-CM | POA: Diagnosis not present

## 2023-11-05 DIAGNOSIS — F418 Other specified anxiety disorders: Secondary | ICD-10-CM

## 2023-11-05 MED ORDER — LORAZEPAM 0.5 MG PO TABS: 0.5000 mg | ORAL_TABLET | Freq: Two times a day (BID) | ORAL | 1 refills | Status: DC | PRN

## 2023-11-05 MED ORDER — LOPERAMIDE HCL 2 MG PO TABS: 2.0000 mg | ORAL_TABLET | Freq: Four times a day (QID) | ORAL | 0 refills | Status: DC | PRN

## 2023-11-05 MED ORDER — OZEMPIC (0.25 OR 0.5 MG/DOSE) 2 MG/3ML ~~LOC~~ SOPN: 0.5000 mg | PEN_INJECTOR | SUBCUTANEOUS | 2 refills | Status: DC

## 2023-11-05 MED ORDER — ONDANSETRON HCL 4 MG PO TABS: 4.0000 mg | ORAL_TABLET | Freq: Three times a day (TID) | ORAL | 0 refills | Status: DC | PRN

## 2023-11-05 NOTE — Patient Instructions (Addendum)
We will go back to your previous dose of Ozempic 0.5 mg weekly.  I have sent a refill of the Ativan to get you through your next appointment with Dr. Lolly Mustache.  If you need to, you can take the ondansetron (Zofran) for nausea, and/or the loperamide (Imodium) for diarrhea.  Make sure that you are staying hydrated with how much fluid you have been losing!  As your appetite returns, eating foods that are high in fiber will also help with diarrhea.  If you need anything in the next couple of weeks, please send me a MyChart message.  I hope you feel better soon!

## 2023-11-05 NOTE — Assessment & Plan Note (Signed)
Ozempic 1 mg weekly has caused unbearable GI side effects and creased anxiety.  Return to Ozempic 0.5 mg weekly and working with endocrinology for weight management and glucose control.  Will also prescribe Zoloft to manage nausea and vomiting this week.

## 2023-11-05 NOTE — Progress Notes (Signed)
Acute Office Visit  Subjective:     Patient ID: Angela Mcintyre, female    DOB: 21-Sep-1956, 68 y.o.   MRN: [id number]  Chief Complaint  Patient presents with   Nausea   Diarrhea   Emesis   Anxiety    HPI Patient is in today for nausea, vomiting, diarrhea, and increased anxiety since starting the higher dose of Ozempic in the past 2 weeks.  She has not been able to eat for several days now.  She would like to go back to her previous dose of Ozempic at 0.5 mg weekly.  She also reports that she is completely out of Ativan, so the increased anxiety has been unbearable.  ROS See HPI    Objective:    BP 132/88   Pulse (!) 116   Ht 5\' 4"  (1.626 m)   Wt 183 lb (83 kg)   SpO2 94%   BMI 31.41 kg/m   Physical Exam Constitutional:      General: She is not in acute distress.    Appearance: Normal appearance.  HENT:     Head: Normocephalic and atraumatic.  Pulmonary:     Effort: Pulmonary effort is normal. No respiratory distress.  Musculoskeletal:     Cervical back: Normal range of motion.  Neurological:     General: No focal deficit present.     Mental Status: She is alert and oriented to person, place, and time. Mental status is at baseline.  Psychiatric:        Attention and Perception: Attention normal.        Mood and Affect: Mood normal. Affect is tearful.        Behavior: Behavior is cooperative.        Thought Content: Thought content normal.        Judgment: Judgment normal.      Assessment & Plan:  Type 2 diabetes mellitus without complication, without long-term current use of insulin (HCC) Assessment & Plan: Ozempic 1 mg weekly has caused unbearable GI side effects and creased anxiety.  Return to Ozempic 0.5 mg weekly and working with endocrinology for weight management and glucose control.  Will also prescribe Zoloft to manage nausea and vomiting this week.  Orders: -     Ozempic (0.25 or 0.5 MG/DOSE); Inject 0.5 mg into the skin once a week.  Dispense: 3 mL;  Refill: 2  Nausea and vomiting, unspecified vomiting type -     Ondansetron HCl; Take 1 tablet (4 mg total) by mouth every 8 (eight) hours as needed for nausea or vomiting.  Dispense: 20 tablet; Refill: 0  Diarrhea, unspecified type -     Loperamide HCl; Take 1 tablet (2 mg total) by mouth 4 (four) times daily as needed for diarrhea or loose stools.  Dispense: 10 tablet; Refill: 0  Meniere's disease, unspecified laterality -     LORazepam; Take 1 tablet (0.5 mg total) by mouth 2 (two) times daily as needed for up to 20 days for anxiety.  Dispense: 20 tablet; Refill: 1  PTSD (post-traumatic stress disorder) -     LORazepam; Take 1 tablet (0.5 mg total) by mouth 2 (two) times daily as needed for up to 20 days for anxiety.  Dispense: 20 tablet; Refill: 1  Anxiety associated with depression -     LORazepam; Take 1 tablet (0.5 mg total) by mouth 2 (two) times daily as needed for up to 20 days for anxiety.  Dispense: 20 tablet; Refill: 1  Use ondansetron  for nausea and vomiting as needed, and loperamide for diarrhea as needed.  Provided refill of lorazepam to get her through her next appointment with Dr. Lolly Mustache.  Return in about 3 months (around 01/31/2024) for follow-up (as scheduled).  Melida Quitter, PA

## 2023-11-05 NOTE — Telephone Encounter (Signed)
Chief Complaint: reaction to Ozempic dosage increase Symptoms: nausea, vomiting, diarrhea, anxiety Frequency: since taking Ozempic dose on 10/25/23 Pertinent Negatives: Patient denies thoughts of self harm, dehydration Disposition: [] ED /[] Urgent Care (no appt availability in office) / [x] Appointment(In office/virtual)/ []  [facility]/ [] Home Care/ [] Refused Recommended Disposition /[] [facility]/ []  Follow-up with PCP Additional Notes: Patient states she had her Ozempic dosage increased during her last office visit on 10/22/23. Patient states she took doses on 1/10 and 1/17 and states since then she has been experiencing nausea, vomiting, shakiness, diarrhea, and anxiety. Patient states she has been able to drink 6+ 16 oz bottles of water and denies dehydration but states the anxiety feels severe. Patient given reassurance and sounds improved regarding anxiety. Patient states her sister will drive her to appointment this AM.  Copied from CRM [id number]. Topic: Clinical - Red Word Triage >> Nov 05, 2023  8:06 AM Donita Brooks wrote: Red Word that prompted transfer to Nurse Triage: pt is having bad side effects from diabetes shot that given by Provider. Pt is sick, shaking and nervous to get up Reason for Disposition  Patient sounds very upset or troubled to the triager  Answer Assessment - Initial Assessment Questions 1. CONCERN: "Did anything happen that prompted you to call today?"      Patient states she has been feeling shaking, nausea, vomiting and diarrhea since taking her increased dose of Ozempic. Patient feeling anxious, jittery.  2. ANXIETY SYMPTOMS: "Can you describe how you (your loved one; patient) have been feeling?" (e.g., tense, restless, panicky, anxious, keyed up, overwhelmed, sense of impending doom).      Anxious, shaking; nervous.  3. ONSET: "How long have you been feeling this way?" (e.g., hours, days, weeks)     Since 10/25/23 when taking her new dose of  Ozempic.  4. SEVERITY: "How would you rate the level of anxiety?" (e.g., 0 - 10; or mild, moderate, severe).     10/10.  5. FUNCTIONAL IMPAIRMENT: "How have these feelings affected your ability to do daily activities?" "Have you had more difficulty than usual doing your normal daily activities?" (e.g., getting better, same, worse; self-care, school, work, interactions)     Patient states she has been in bed the last 3 days and feels unwell when she gets up.  6. HISTORY: "Have you felt this way before?" "Have you ever been diagnosed with an anxiety problem in the past?" (e.g., generalized anxiety disorder, panic attacks, PTSD). If Yes, ask: "How was this problem treated?" (e.g., medicines, counseling, etc.)     Patient states in the past when she was on metformin she had similar issues.  7. RISK OF HARM - SUICIDAL IDEATION: "Do you ever have thoughts of hurting or killing yourself?" If Yes, ask:  "Do you have these feelings now?" "Do you have a plan on how you would do this?"     Denies any thoughts to harm herself or others.   8. POTENTIAL TRIGGERS: "Do you drink caffeinated beverages (e.g., coffee, colas, teas), and how much daily?" "Do you drink alcohol or use any drugs?" "Have you started any new medicines recently?"       Normally drinks chai tea but states she has only been drinking water.  9. PATIENT SUPPORT: "Who is with you now?" "Who do you live with?" "Do you have family or friends who you can talk to?"        Family at home with patient.  10. OTHER SYMPTOMS: "Do you have  any other symptoms?" (e.g., feeling depressed, trouble concentrating, trouble sleeping, trouble breathing, palpitations or fast heartbeat, chest pain, sweating, nausea, or diarrhea)       Nausea, vomiting, diarrhea, unable to eat for 3 days.  Protocols used: Anxiety and Panic Attack-A-AH

## 2023-11-06 ENCOUNTER — Other Ambulatory Visit (HOSPITAL_COMMUNITY): Payer: Self-pay

## 2023-11-06 ENCOUNTER — Other Ambulatory Visit (HOSPITAL_COMMUNITY): Payer: Self-pay | Admitting: Psychiatry

## 2023-11-06 DIAGNOSIS — F4310 Post-traumatic stress disorder, unspecified: Secondary | ICD-10-CM

## 2023-11-06 DIAGNOSIS — F418 Other specified anxiety disorders: Secondary | ICD-10-CM

## 2023-11-06 DIAGNOSIS — H8109 Meniere's disease, unspecified ear: Secondary | ICD-10-CM

## 2023-11-06 MED ORDER — SERTRALINE HCL 100 MG PO TABS: 100.0000 mg | ORAL_TABLET | Freq: Every day | ORAL | 0 refills | Status: DC

## 2023-11-06 MED ORDER — LORAZEPAM 0.5 MG PO TABS: 0.5000 mg | ORAL_TABLET | Freq: Every day | ORAL | 0 refills | Status: DC

## 2023-11-07 ENCOUNTER — Telehealth: Payer: Self-pay

## 2023-11-07 NOTE — Telephone Encounter (Signed)
We are very glad to hear that.

## 2023-11-07 NOTE — Telephone Encounter (Signed)
Copied from CRM [id number]. Topic: General - Other >> Nov 07, 2023 10:39 AM Donita Brooks wrote: Reason for CRM: This message is for Dr. Lequita Halt and nurse from pt - "Thank you for everything, I really appreciate it. I am feeling better now."

## 2023-11-20 ENCOUNTER — Telehealth (HOSPITAL_BASED_OUTPATIENT_CLINIC_OR_DEPARTMENT_OTHER): Payer: 59 | Admitting: Psychiatry

## 2023-11-20 ENCOUNTER — Encounter (HOSPITAL_COMMUNITY): Payer: Self-pay | Admitting: Psychiatry

## 2023-11-20 VITALS — Wt 183.0 lb

## 2023-11-20 DIAGNOSIS — F418 Other specified anxiety disorders: Secondary | ICD-10-CM | POA: Diagnosis not present

## 2023-11-20 DIAGNOSIS — F4310 Post-traumatic stress disorder, unspecified: Secondary | ICD-10-CM | POA: Diagnosis not present

## 2023-11-20 DIAGNOSIS — H8109 Meniere's disease, unspecified ear: Secondary | ICD-10-CM | POA: Diagnosis not present

## 2023-11-20 MED ORDER — LORAZEPAM 0.5 MG PO TABS: 0.5000 mg | ORAL_TABLET | Freq: Every day | ORAL | 1 refills | Status: DC

## 2023-11-20 MED ORDER — SERTRALINE HCL 100 MG PO TABS: 150.0000 mg | ORAL_TABLET | Freq: Every day | ORAL | 1 refills | Status: DC
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
## 2023-11-21 ENCOUNTER — Encounter: Payer: Self-pay | Admitting: Family Medicine

## 2023-12-12 ENCOUNTER — Other Ambulatory Visit: Payer: Self-pay | Admitting: Family Medicine

## 2023-12-12 DIAGNOSIS — G8929 Other chronic pain: Secondary | ICD-10-CM

## 2023-12-27 ENCOUNTER — Other Ambulatory Visit: Payer: Self-pay | Admitting: Family Medicine

## 2023-12-27 DIAGNOSIS — I10 Essential (primary) hypertension: Secondary | ICD-10-CM

## 2024-01-10 ENCOUNTER — Encounter (HOSPITAL_COMMUNITY): Payer: Self-pay | Admitting: Psychiatry

## 2024-01-10 ENCOUNTER — Telehealth (HOSPITAL_COMMUNITY): Admitting: Psychiatry

## 2024-01-10 VITALS — Wt 187.0 lb

## 2024-01-10 DIAGNOSIS — F418 Other specified anxiety disorders: Secondary | ICD-10-CM

## 2024-01-10 DIAGNOSIS — F4310 Post-traumatic stress disorder, unspecified: Secondary | ICD-10-CM | POA: Diagnosis not present

## 2024-01-10 DIAGNOSIS — H8109 Meniere's disease, unspecified ear: Secondary | ICD-10-CM

## 2024-01-10 MED ORDER — SERTRALINE HCL 100 MG PO TABS: 150.0000 mg | ORAL_TABLET | Freq: Every day | ORAL | 2 refills | Status: DC

## 2024-01-10 MED ORDER — LORAZEPAM 0.5 MG PO TABS: 0.5000 mg | ORAL_TABLET | Freq: Every day | ORAL | 2 refills | Status: AC
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
## 2024-01-14 ENCOUNTER — Other Ambulatory Visit: Payer: Self-pay | Admitting: Family Medicine

## 2024-01-14 DIAGNOSIS — E1159 Type 2 diabetes mellitus with other circulatory complications: Secondary | ICD-10-CM

## 2024-01-21 ENCOUNTER — Telehealth: Payer: Self-pay | Admitting: *Deleted

## 2024-01-21 ENCOUNTER — Other Ambulatory Visit: Payer: Self-pay | Admitting: *Deleted

## 2024-01-21 DIAGNOSIS — E782 Mixed hyperlipidemia: Secondary | ICD-10-CM

## 2024-01-21 DIAGNOSIS — E119 Type 2 diabetes mellitus without complications: Secondary | ICD-10-CM

## 2024-01-21 DIAGNOSIS — E1159 Type 2 diabetes mellitus with other circulatory complications: Secondary | ICD-10-CM

## 2024-01-21 NOTE — Telephone Encounter (Signed)
 LVM for pt to call office to see about rescheduling her appt that was scheduled on 4/18 with morgan.  That appt was cancelled since she is no longer here at this office. Please assist in getting this appt rescheduled if pt calls back.

## 2024-01-24 ENCOUNTER — Other Ambulatory Visit: Payer: 59

## 2024-01-24 DIAGNOSIS — E1159 Type 2 diabetes mellitus with other circulatory complications: Secondary | ICD-10-CM | POA: Diagnosis not present

## 2024-01-24 DIAGNOSIS — E782 Mixed hyperlipidemia: Secondary | ICD-10-CM

## 2024-01-24 DIAGNOSIS — I152 Hypertension secondary to endocrine disorders: Secondary | ICD-10-CM | POA: Diagnosis not present

## 2024-01-24 DIAGNOSIS — E119 Type 2 diabetes mellitus without complications: Secondary | ICD-10-CM | POA: Diagnosis not present

## 2024-01-25 LAB — CBC WITH DIFFERENTIAL/PLATELET
Basophils Absolute: 0 10*3/uL (ref 0.0–0.2)
Basos: 0 %
EOS (ABSOLUTE): 0.3 10*3/uL (ref 0.0–0.4)
Eos: 3 %
Hematocrit: 50.5 % — ABNORMAL HIGH (ref 34.0–46.6)
Hemoglobin: 16.4 g/dL — ABNORMAL HIGH (ref 11.1–15.9)
Immature Grans (Abs): 0.1 10*3/uL (ref 0.0–0.1)
Immature Granulocytes: 1 %
Lymphocytes Absolute: 2.5 10*3/uL (ref 0.7–3.1)
Lymphs: 27 %
MCH: 29.7 pg (ref 26.6–33.0)
MCHC: 32.5 g/dL (ref 31.5–35.7)
MCV: 91 fL (ref 79–97)
Monocytes Absolute: 0.5 10*3/uL (ref 0.1–0.9)
Monocytes: 5 %
Neutrophils Absolute: 5.8 10*3/uL (ref 1.4–7.0)
Neutrophils: 64 %
Platelets: 325 10*3/uL (ref 150–450)
RBC: 5.53 x10E6/uL — ABNORMAL HIGH (ref 3.77–5.28)
RDW: 13.7 % (ref 11.7–15.4)
WBC: 9.1 10*3/uL (ref 3.4–10.8)

## 2024-01-25 LAB — COMPREHENSIVE METABOLIC PANEL WITH GFR
ALT: 12 IU/L (ref 0–32)
AST: 15 IU/L (ref 0–40)
Albumin: 4.6 g/dL (ref 3.9–4.9)
Alkaline Phosphatase: 114 IU/L (ref 44–121)
BUN/Creatinine Ratio: 10 — ABNORMAL LOW (ref 12–28)
BUN: 8 mg/dL (ref 8–27)
Bilirubin Total: 0.6 mg/dL (ref 0.0–1.2)
CO2: 25 mmol/L (ref 20–29)
Calcium: 9.5 mg/dL (ref 8.7–10.3)
Chloride: 100 mmol/L (ref 96–106)
Creatinine, Ser: 0.77 mg/dL (ref 0.57–1.00)
Globulin, Total: 2.7 g/dL (ref 1.5–4.5)
Glucose: 99 mg/dL (ref 70–99)
Potassium: 4.2 mmol/L (ref 3.5–5.2)
Sodium: 141 mmol/L (ref 134–144)
Total Protein: 7.3 g/dL (ref 6.0–8.5)
eGFR: 84 mL/min/{1.73_m2} (ref >59)

## 2024-01-25 LAB — HEMOGLOBIN A1C
Est. average glucose Bld gHb Est-mCnc: 123 mg/dL
Hgb A1c MFr Bld: 5.9 % — ABNORMAL HIGH (ref 4.8–5.6)

## 2024-01-25 LAB — LIPID PANEL
Chol/HDL Ratio: 5 ratio — ABNORMAL HIGH (ref 0.0–4.4)
Cholesterol, Total: 229 mg/dL — ABNORMAL HIGH (ref 100–199)
HDL: 46 mg/dL (ref >39)
LDL Chol Calc (NIH): 166 mg/dL — ABNORMAL HIGH (ref 0–99)
Triglycerides: 96 mg/dL (ref 0–149)
VLDL Cholesterol Cal: 17 mg/dL (ref 5–40)

## 2024-01-25 LAB — MICROALBUMIN / CREATININE URINE RATIO
Creatinine, Urine: 153.8 mg/dL
Microalb/Creat Ratio: 7 mg/g{creat} (ref 0–29)
Microalbumin, Urine: 10.7 ug/mL

## 2024-01-31 ENCOUNTER — Ambulatory Visit: Payer: 59 | Admitting: Family Medicine

## 2024-02-13 ENCOUNTER — Ambulatory Visit: Admitting: Family Medicine

## 2024-02-13 ENCOUNTER — Encounter: Payer: Self-pay | Admitting: Family Medicine

## 2024-02-13 VITALS — BP 122/71 | HR 77 | Ht 64.0 in | Wt 185.1 lb

## 2024-02-13 DIAGNOSIS — E1169 Type 2 diabetes mellitus with other specified complication: Secondary | ICD-10-CM | POA: Insufficient documentation

## 2024-02-13 DIAGNOSIS — J449 Chronic obstructive pulmonary disease, unspecified: Secondary | ICD-10-CM | POA: Diagnosis not present

## 2024-02-13 DIAGNOSIS — E785 Hyperlipidemia, unspecified: Secondary | ICD-10-CM | POA: Diagnosis not present

## 2024-02-13 DIAGNOSIS — M5431 Sciatica, right side: Secondary | ICD-10-CM

## 2024-02-13 DIAGNOSIS — E041 Nontoxic single thyroid nodule: Secondary | ICD-10-CM

## 2024-02-13 DIAGNOSIS — Z7985 Long-term (current) use of injectable non-insulin antidiabetic drugs: Secondary | ICD-10-CM

## 2024-02-13 DIAGNOSIS — E119 Type 2 diabetes mellitus without complications: Secondary | ICD-10-CM

## 2024-02-13 NOTE — Progress Notes (Signed)
   Established Patient Office Visit  Subjective   Patient ID: Angela Mcintyre, female    DOB: [birth year]  Age: 68 y.o. MRN: [id number]  Chief Complaint  Patient presents with   Medical Management of Chronic Issues    HPI  Subjective: - Sciatica: Reports pain when turning foot. History of episodes where "it went out completely" and couldn't walk. Takes Flexeril  2x/day (morning and night). Refuses surgical interventions. - COPD: Concerned about breathing difficulties related to weather changes. Uses Trilogy inhaler daily (morning) and albuterol  2 puffs PRN (uses ~once daily). - Lab results: Concerned about elevated red blood cell count and urine test results. - Medications: Ozempic  0.5mg , Singular, antihypertensive medication. Not taking prescribed Crestor . - PMHx: COPD, diabetes, hypertension, high cholesterol, sleep difficulties. - Social: Quit smoking "long time ago." Reports being active with household chores (laundry, stairs). - Allergies: Seasonal allergies. Declines Flonase  ("won't put nothing in my nose").  Review of Systems: - Constitutional symptoms: Denies fever, chills. - Eyes: Had diabetic eye exam, reported as normal. Mentioned possible "little cataract." - Ears, Nose, Mouth, Throat: No reported issues. - Cardiovascular: No reported issues. BP controlled per patient self-monitoring. - Respiratory: Breathing difficulties with warm weather, harder to breathe with seasonal changes. - Gastrointestinal: No reported issues. - Genitourinary: No reported issues. - Musculoskeletal: Sciatica pain, radiating down leg. - Integumentary (Skin): No reported issues. - Neurological: No reported issues. - Psychiatric: No reported issues. - Endocrine: No reported issues. - Hematologic/Lymphatic: Slightly elevated hemoglobin noted in labs. - Allergic/Immunologic: Seasonal allergies.     The 10-year ASCVD risk score (Arnett DK, et al., 2019) is: 19%  Health Maintenance Due  Topic Date  Due   Fecal DNA (Cologuard)  Never done   COVID-19 Vaccine (3 - 2024-25 season) 06/16/2023   Medicare Annual Wellness (AWV)  02/28/2024      Objective:     BP 122/71   Pulse 77   Ht 5\' 4"  (1.626 m)   Wt 185 lb 1.9 oz (84 kg)   SpO2 93%   BMI 31.78 kg/m    Physical Exam Gen: alert, oriented Cv: rrr Pulm: mild diffuse wheezing b/l. No resp distress. No tachypnea Ext: normal monofilament testing.  Normal dp pulse b/l   No results found for any visits on 02/13/24.      Assessment & Plan:   Hyperlipidemia associated with type 2 diabetes mellitus (HCC) Assessment & Plan: Elevated.  Pt did not take crestor  prescribed at last visit.  States she will take it starting now.  Will recheck cholesterol in 6 monts.    Chronic obstructive pulmonary disease, unspecified COPD type (HCC) Assessment & Plan: Worsened breathing likely exacerbated by seasonal allergies.  Taking singulair . Advised to start taking zyrtec or claritin regularly.  Taking trilogy inhaler as prescribed and albuterol  prn.    Chronic sciatica of right side Assessment & Plan: Continue flexeril  prn.  Declines surgical/orthopedic interventions.  Discussed otc pain mgmt options   Thyroid  nodule Assessment & Plan: Has f/u with endocrinologist upcoming.  States she is due for repeat u/s.  Last u/s was 1 year ago.    Type 2 diabetes mellitus without complication, without long-term current use of insulin (HCC) Assessment & Plan: Well controlled.  Continue ozempic .  Foot exam normal today.        Return in about 6 months (around 08/15/2024) for hld, DM.    Laneta Pintos, MD

## 2024-02-13 NOTE — Assessment & Plan Note (Signed)
 Has f/u with endocrinologist upcoming.  States she is due for repeat u/s.  Last u/s was 1 year ago.

## 2024-02-13 NOTE — Assessment & Plan Note (Signed)
 Well controlled.  Continue ozempic .  Foot exam normal today.

## 2024-02-13 NOTE — Patient Instructions (Signed)
 It was nice to see you today,  We addressed the following topics today: -Your A1c is 5.9 which is really good.  Continue taking your Ozempic . - Your cholesterol is elevated.  It is important to take your Crestor  daily. - Your hemoglobin was slightly elevated but only mildly and we will just need to recheck this in 6 months - Your lungs sounded slightly wheezy.  I would recommend taking the over-the-counter generic Claritin or Zyrtec in addition to your Singulair  in case the seasonal allergies are worsening your respiratory symptoms   Have a great day,  Etha Henle, MD

## 2024-02-13 NOTE — Assessment & Plan Note (Signed)
 Elevated.  Pt did not take crestor  prescribed at last visit.  States she will take it starting now.  Will recheck cholesterol in 6 monts.

## 2024-02-13 NOTE — Assessment & Plan Note (Signed)
 Worsened breathing likely exacerbated by seasonal allergies.  Taking singulair . Advised to start taking zyrtec or claritin regularly.  Taking trilogy inhaler as prescribed and albuterol  prn.

## 2024-02-13 NOTE — Assessment & Plan Note (Signed)
 Continue flexeril  prn.  Declines surgical/orthopedic interventions.  Discussed otc pain mgmt options

## 2024-02-26 ENCOUNTER — Other Ambulatory Visit: Payer: Self-pay | Admitting: Family Medicine

## 2024-02-26 DIAGNOSIS — E119 Type 2 diabetes mellitus without complications: Secondary | ICD-10-CM

## 2024-02-28 ENCOUNTER — Ambulatory Visit (INDEPENDENT_AMBULATORY_CARE_PROVIDER_SITE_OTHER)

## 2024-02-28 DIAGNOSIS — Z0000 Encounter for general adult medical examination without abnormal findings: Secondary | ICD-10-CM

## 2024-02-28 NOTE — Patient Instructions (Signed)
 Ms. Frieling , Thank you for taking time out of your busy schedule to complete your Annual Wellness Visit with me. I enjoyed our conversation and look forward to speaking with you again next year. I, as well as your care team,  appreciate your ongoing commitment to your health goals. Please review the following plan we discussed and let me know if I can assist you in the future. Your Game plan/ To Do List    Referrals: If you haven't heard from the office you've been referred to, please reach out to them at the phone provided.  N/a Follow up Visits: Next Medicare AWV with our clinical staff: 04/08/2025 at 8:10   Have you seen your provider in the last 6 months (3 months if uncontrolled diabetes)? Yes Next Office Visit with your provider: 08/18/2024 at 8:10  Clinician Recommendations:  Aim for 30 minutes of exercise or brisk walking, 6-8 glasses of water, and 5 servings of fruits and vegetables each day.       This is a list of the screening recommended for you and due dates:  Health Maintenance  Topic Date Due   Cologuard (Stool DNA test)  Never done   COVID-19 Vaccine (3 - 2024-25 season) 06/16/2023   Mammogram  02/23/2024   Flu Shot  05/15/2024   Hemoglobin A1C  07/25/2024   Eye exam for diabetics  08/28/2024   Yearly kidney function blood test for diabetes  01/23/2025   Yearly kidney health urinalysis for diabetes  01/23/2025   Complete foot exam   02/12/2025   Medicare Annual Wellness Visit  02/27/2025   DTaP/Tdap/Td vaccine (2 - Td or Tdap) 12/22/2031   Pneumonia Vaccine  Completed   DEXA scan (bone density measurement)  Completed   Hepatitis C Screening  Completed   Zoster (Shingles) Vaccine  Completed   HPV Vaccine  Aged Out   Meningitis B Vaccine  Aged Out    Advanced directives: (Declined) Advance directive discussed with you today. Even though you declined this today, please call our office should you change your mind, and we can give you the proper paperwork for you to  fill out. Advance Care Planning is important because it:  [x]  Makes sure you receive the medical care that is consistent with your values, goals, and preferences  [x]  It provides guidance to your family and loved ones and reduces their decisional burden about whether or not they are making the right decisions based on your wishes.  Follow the link provided in your after visit summary or read over the paperwork we have mailed to you to help you started getting your Advance Directives in place. If you need assistance in completing these, please reach out to us  so that we can help you!  See attachments for Preventive Care and Fall Prevention Tips.

## 2024-02-28 NOTE — Progress Notes (Signed)
 Subjective:   Angela Mcintyre is a 68 y.o. who presents for a Medicare Wellness preventive visit.  As a reminder, Annual Wellness Visits don't include a physical exam, and some assessments may be limited, especially if this visit is performed virtually. We may recommend an in-person visit if needed.  Visit Complete: Virtual I connected with  Angela Mcintyre on 02/28/24 by a audio enabled telemedicine application and verified that I am speaking with the correct person using two identifiers.  Patient Location: Home  Provider Location: Office/Clinic  I discussed the limitations of evaluation and management by telemedicine. The patient expressed understanding and agreed to proceed.  Vital Signs: Because this visit was a virtual/telehealth visit, some criteria may be missing or patient reported. Any vitals not documented were not able to be obtained and vitals that have been documented are patient reported.  VideoError- Librarian, academic were attempted between this provider and patient, however failed, due to patient having technical difficulties OR patient did not have access to video capability.  We continued and completed visit with audio only.   Persons Participating in Visit: Patient.  AWV Questionnaire: Yes: Patient Medicare AWV questionnaire was completed by the patient on 02/24/2024; I have confirmed that all information answered by patient is correct and no changes since this date.  Cardiac Risk Factors include: advanced age (>70men, >7 women);diabetes mellitus;dyslipidemia;hypertension     Objective:     Today's Vitals   There is no height or weight on file to calculate BMI.     02/28/2024    8:15 AM 02/28/2023    1:48 PM  Advanced Directives  Does Patient Have a Medical Advance Directive? No No  Would patient like information on creating a medical advance directive? No - Patient declined No - Patient declined    Current Medications  (verified) Outpatient Encounter Medications as of 02/28/2024  Medication Sig   albuterol  (VENTOLIN  HFA) 108 (90 Base) MCG/ACT inhaler Inhale 2 puffs into the lungs every 6 (six) hours as needed for wheezing.   cyclobenzaprine  (FLEXERIL ) 10 MG tablet TAKE 1 TABLET BY MOUTH AT BEDTIME AS NEEDED FOR MUSCLE PAIN/ SPASMS   Fluticasone -Umeclidin-Vilant (TRELEGY ELLIPTA ) 100-62.5-25 MCG/ACT AEPB Inhale 1 puff into the lungs daily.   Glucose Blood (RELION TRUE METRIX TEST STRIPS VI) 1 Stick by In Vitro route in the morning, at noon, and at bedtime.   glucose blood (RELION TRUE METRIX TEST STRIPS) test strip Test blood sugar 3 times daily   hydrochlorothiazide  (HYDRODIURIL ) 25 MG tablet TAKE 1 TABLET (25 MG TOTAL) BY MOUTH DAILY.   LORazepam  (ATIVAN ) 0.5 MG tablet Take 1 tablet (0.5 mg total) by mouth at bedtime.   metoprolol  tartrate (LOPRESSOR ) 25 MG tablet TAKE 1 TABLET BY MOUTH 2 TIMES DAILY WITH A MEAL.   rosuvastatin  (CRESTOR ) 5 MG tablet TAKE 1 TABLET (5 MG TOTAL) BY MOUTH DAILY.   Semaglutide ,0.25 or 0.5MG /DOS, (OZEMPIC , 0.25 OR 0.5 MG/DOSE,) 2 MG/3ML SOPN Inject 0.5 mg into the skin once a week.   sertraline  (ZOLOFT ) 100 MG tablet Take 1.5 tablets (150 mg total) by mouth daily.   albuterol  (PROVENTIL ) (2.5 MG/3ML) 0.083% nebulizer solution Use 1 vial nebulized every 6 hours as needed for shortness of breath (Patient not taking: Reported on 02/28/2024)   loperamide  (IMODIUM  A-D) 2 MG tablet Take 1 tablet (2 mg total) by mouth 4 (four) times daily as needed for diarrhea or loose stools.   ondansetron  (ZOFRAN ) 4 MG tablet Take 1 tablet (4 mg total)  by mouth every 8 (eight) hours as needed for nausea or vomiting.   No facility-administered encounter medications on file as of 02/28/2024.    Allergies (verified) Gabapentin   History: Past Medical History:  Diagnosis Date   High blood pressure    Past Surgical History:  Procedure Laterality Date   CESAREAN SECTION  1973   CESAREAN SECTION      Family History  Problem Relation Age of Onset   Breast cancer Neg Hx    Social History   Socioeconomic History   Marital status: Single    Spouse name: Not on file   Number of children: Not on file   Years of education: Not on file   Highest education level: 11th grade  Occupational History   Not on file  Tobacco Use   Smoking status: Former    Types: Cigarettes    Passive exposure: Past   Smokeless tobacco: Never  Vaping Use   Vaping status: Never Used  Substance and Sexual Activity   Alcohol use: Not Currently   Drug use: Not Currently   Sexual activity: Yes    Partners: Male  Other Topics Concern   Not on file  Social History Narrative   Not on file   Social Drivers of Health   Financial Resource Strain: Low Risk  (02/28/2024)   Overall Financial Resource Strain (CARDIA)    Difficulty of Paying Living Expenses: Not hard at all  Food Insecurity: No Food Insecurity (02/28/2024)   Hunger Vital Sign    Worried About Running Out of Food in the Last Year: Never true    Ran Out of Food in the Last Year: Never true  Transportation Needs: No Transportation Needs (02/28/2024)   PRAPARE - Administrator, Civil Service (Medical): No    Lack of Transportation (Non-Medical): No  Physical Activity: Inactive (02/28/2024)   Exercise Vital Sign    Days of Exercise per Week: 0 days    Minutes of Exercise per Session: 0 min  Stress: Stress Concern Present (02/28/2024)   [employer] of Occupational Health - Occupational Stress Questionnaire    Feeling of Stress : To some extent  Social Connections: Socially Isolated (02/28/2024)   Social Connection and Isolation Panel [NHANES]    Frequency of Communication with Friends and Family: More than three times a week    Frequency of Social Gatherings with Friends and Family: More than three times a week    Attends Religious Services: Never    Database administrator or Organizations: No    Attends Hospital doctor: Never    Marital Status: Divorced    Tobacco Counseling Counseling given: Not Answered    Clinical Intake:  Pre-visit preparation completed: Yes  Pain : No/denies pain     Nutritional Risks: None Diabetes: Yes CBG done?: No Did pt. bring in CBG monitor from home?: No  Lab Results  Component Value Date   HGBA1C 5.9 (H) 01/24/2024   HGBA1C 6.0 (H) 08/12/2023   HGBA1C 6.1 (H) 04/08/2023     How often do you need to have someone help you when you read instructions, pamphlets, or other written materials from your doctor or pharmacy?: 1 - Never  Interpreter Needed?: No  Information entered by :: NAllen LPN   Activities of Daily Living     02/24/2024   11:28 AM  In your present state of health, do you have any difficulty performing the following activities:  Hearing? 0  Vision?  0  Difficulty concentrating or making decisions? 0  Walking or climbing stairs? 1  Comment due to COPD  Dressing or bathing? 0  Doing errands, shopping? 0  Preparing Food and eating ? N  Using the Toilet? N  In the past six months, have you accidently leaked urine? Y  Comment wears a pad  Do you have problems with loss of bowel control? N  Managing your Medications? N  Managing your Finances? N  Housekeeping or managing your Housekeeping? N    Patient Care Team: Noreene Bearded, PA as PCP - General (Family Medicine) [facility]  Indicate any recent Medical Services you may have received from other than [facility] providers in the past year (date may be approximate).     Assessment:    This is a routine wellness examination for Angela Mcintyre.  Hearing/Vision screen Hearing Screening - Comments:: Denies hearing issues Vision Screening - Comments:: No regular eye exams   Goals Addressed             This Visit's Progress    Patient Stated       02/28/2024, wants to get cholesterol down       Depression Screen     02/28/2024    8:17 AM 02/13/2024   10:38  AM 10/22/2023    9:25 AM 08/19/2023    9:28 AM 04/16/2023    9:10 AM 02/28/2023    1:42 PM 06/14/2022    9:32 AM  PHQ 2/9 Scores  PHQ - 2 Score  4 2 2 4 2 2   PHQ- 9 Score  18 9 9 18 4 9   Exception Documentation Patient refusal          Fall Risk     02/24/2024   11:28 AM 02/13/2024   10:39 AM 10/22/2023    9:25 AM 02/28/2023    1:48 PM 02/26/2023    5:42 PM  Fall Risk   Falls in the past year? 0 0 0 0 0  Number falls in past yr: 0 0 0 0 0  Injury with Fall? 0 0 0 0 1  Risk for fall due to : Medication side effect No Fall Risks No Fall Risks No Fall Risks   Follow up Falls prevention discussed;Falls evaluation completed Falls evaluation completed Falls evaluation completed Falls prevention discussed     MEDICARE RISK AT HOME:  Medicare Risk at Home Any stairs in or around the home?: (Patient-Rptd) Yes If so, are there any without handrails?: (Patient-Rptd) No Home free of loose throw rugs in walkways, pet beds, electrical cords, etc?: (Patient-Rptd) Yes Adequate lighting in your home to reduce risk of falls?: (Patient-Rptd) Yes Life alert?: (Patient-Rptd) No Use of a cane, walker or w/c?: (Patient-Rptd) No Grab bars in the bathroom?: (Patient-Rptd) No Shower chair or bench in shower?: (Patient-Rptd) No Elevated toilet seat or a handicapped toilet?: (Patient-Rptd) No  TIMED UP AND GO:  Was the test performed?  No  Cognitive Function: 6CIT completed        02/28/2024    8:17 AM 02/28/2023    1:49 PM  6CIT Screen  What Year? 0 points 0 points  What month? 0 points 0 points  What time? 0 points 0 points  Count back from 20 0 points 0 points  Months in reverse 0 points 0 points  Repeat phrase 6 points 0 points  Total Score 6 points 0 points    Immunizations Immunization History  Administered Date(s) Administered   Influenza, High  Dose Seasonal PF 07/19/2022, 07/11/2023   Influenza,inj,Quad PF,6+ Mos 08/06/2021   PFIZER(Purple Top)SARS-COV-2 Vaccination 01/04/2020,  01/26/2020   PNEUMOCOCCAL CONJUGATE-20 11/03/2021   Tdap 12/21/2021   Zoster Recombinant(Shingrix) 12/16/2017, 07/29/2020    Screening Tests Health Maintenance  Topic Date Due   Fecal DNA (Cologuard)  Never done   COVID-19 Vaccine (3 - 2024-25 season) 06/16/2023   MAMMOGRAM  02/23/2024   INFLUENZA VACCINE  05/15/2024   HEMOGLOBIN A1C  07/25/2024   OPHTHALMOLOGY EXAM  08/28/2024   Diabetic kidney evaluation - eGFR measurement  01/23/2025   Diabetic kidney evaluation - Urine ACR  01/23/2025   FOOT EXAM  02/12/2025   Medicare Annual Wellness (AWV)  02/27/2025   DTaP/Tdap/Td (2 - Td or Tdap) 12/22/2031   Pneumonia Vaccine 39+ Years old  Completed   DEXA SCAN  Completed   Hepatitis C Screening  Completed   Zoster Vaccines- Shingrix  Completed   HPV VACCINES  Aged Out   Meningococcal B Vaccine  Aged Out    Health Maintenance  Health Maintenance Due  Topic Date Due   Fecal DNA (Cologuard)  Never done   COVID-19 Vaccine (3 - 2024-25 season) 06/16/2023   MAMMOGRAM  02/23/2024   Health Maintenance Items Addressed: Declines covid vaccine. States will call to schedule mammogram. Has the kit for colloguard.  Additional Screening:  Vision Screening: Recommended annual ophthalmology exams for early detection of glaucoma and other disorders of the eye.  Dental Screening: Recommended annual dental exams for proper oral hygiene  Community Resource Referral / Chronic Care Management: CRR required this visit?  No   CCM required this visit?  No   Plan:    I have personally reviewed and noted the following in the patient's chart:   Medical and social history Use of alcohol, tobacco or illicit drugs  Current medications and supplements including opioid prescriptions. Patient is not currently taking opioid prescriptions. Functional ability and status Nutritional status Physical activity Advanced directives List of other physicians Hospitalizations, surgeries, and ER visits in  previous 12 months Vitals Screenings to include cognitive, depression, and falls Referrals and appointments  In addition, I have reviewed and discussed with patient certain preventive protocols, quality metrics, and best practice recommendations. A written personalized care plan for preventive services as well as general preventive health recommendations were provided to patient.   Areatha Beecham, LPN   [id number]   After Visit Summary: (MyChart) Due to this being a telephonic visit, the after visit summary with patients personalized plan was offered to patient via MyChart   Notes: Nothing significant to report at this time.

## 2024-03-06 ENCOUNTER — Other Ambulatory Visit: Payer: Self-pay | Admitting: Family Medicine

## 2024-03-06 DIAGNOSIS — E119 Type 2 diabetes mellitus without complications: Secondary | ICD-10-CM

## 2024-03-17 ENCOUNTER — Telehealth (HOSPITAL_COMMUNITY): Admitting: Psychiatry

## 2024-03-19 ENCOUNTER — Telehealth (HOSPITAL_COMMUNITY): Admitting: Psychiatry

## 2024-04-13 ENCOUNTER — Telehealth (HOSPITAL_BASED_OUTPATIENT_CLINIC_OR_DEPARTMENT_OTHER): Admitting: Psychiatry

## 2024-04-13 ENCOUNTER — Telehealth: Payer: Self-pay

## 2024-04-13 ENCOUNTER — Encounter (HOSPITAL_COMMUNITY): Payer: Self-pay | Admitting: Psychiatry

## 2024-04-13 VITALS — Wt 187.0 lb

## 2024-04-13 DIAGNOSIS — H8109 Meniere's disease, unspecified ear: Secondary | ICD-10-CM

## 2024-04-13 DIAGNOSIS — F418 Other specified anxiety disorders: Secondary | ICD-10-CM

## 2024-04-13 DIAGNOSIS — F4310 Post-traumatic stress disorder, unspecified: Secondary | ICD-10-CM

## 2024-04-13 MED ORDER — LORAZEPAM 0.5 MG PO TABS: 0.5000 mg | ORAL_TABLET | Freq: Every day | ORAL | 2 refills | Status: DC

## 2024-04-13 MED ORDER — SERTRALINE HCL 100 MG PO TABS: 150.0000 mg | ORAL_TABLET | Freq: Every day | ORAL | 2 refills | Status: DC

## 2024-04-13 NOTE — Telephone Encounter (Signed)
 Contacted pt and she just wanted to let us  know that the prescribing dr would now be managing these two prescriptions
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
## 2024-04-13 NOTE — Telephone Encounter (Signed)
 Copied from CRM [id number]. Topic: Clinical - Prescription Issue >> Apr 13, 2024  3:59 PM DeAngela L wrote: Reason for CRM: patient calling to inform the office that her medication  LORazepam  (ATIVAN ) 0.5 MG tablet and also Certraline hcl 100mg  these medication she need (her Psychiatrists prescribed her this medication and he will do the refills for these prescription)  LORazepam  was discontinued and should not have been discontinued, it was prescribed by the Psychiatrists  Pt num [phone] (M)

## 2024-04-30 ENCOUNTER — Ambulatory Visit: Payer: Self-pay

## 2024-04-30 ENCOUNTER — Telehealth: Payer: Self-pay

## 2024-04-30 NOTE — Telephone Encounter (Deleted)
 Spoke with Angela Mcintyre and encouraged her to go be seen at [facility] today first she was little upset and disconnected the call. I called her back and the advised her that the UC on [address] said the wait time was 15-30 mins but I couldn't promise that it accurate but they also had an appt time of 3:00 today and I could set that up for her today. Angela Mcintyre states she may go sooner to see if she can be seen but if not she will go at 3:00.

## 2024-04-30 NOTE — Telephone Encounter (Signed)
 FYI Only or Action Required?: Action required by provider: request for appointment.  Patient was last seen in primary care on 02/13/2024 by Angela Toribio POUR, MD.  Called Nurse Triage reporting Trauma.  Symptoms began yesterday.  Interventions attempted: Nothing.  Symptoms are: unchanged.  Triage Disposition: See Physician Within 24 Hours  Patient/caregiver understands and will follow disposition?:  Copied from CRM [id number]. Topic: Clinical - Red Word Triage >> Apr 30, 2024  8:03 AM Elle L wrote: Red Word that prompted transfer to Nurse Triage: The patient states she opened a garage and the glass broke on her and she has injuries on her head where she thinks there may be glass still. The patient's back is sore as well. Reason for Disposition  Suspicious history for the injury  Answer Assessment - Initial Assessment Questions 1. MECHANISM: How did the injury happen? For falls, ask: What height did you fall from? and What surface did you fall against?      Garage door broke 2. ONSET: When did the injury happen? (e.g., minutes, hours ago)       yesterday 3. NEUROLOGIC SYMPTOMS: Was there any loss of consciousness? Are there any other neurological symptoms?      Denies LOC 4. MENTAL STATUS: Does the person know who they are, who you are, and where they are?      Denies altered mental status 5. LOCATION: What part of the head was hit?      Back of the head 6. SCALP APPEARANCE: What does the scalp look like? Is it bleeding now? If Yes, ask: Is it difficult to stop?      Denies bleeding 7. SIZE: For cuts, bruises, or swelling, ask: How large is it? (e.g., inches or centimeters)      States swelling  8. PAIN: Is there any pain? If Yes, ask: How bad is it? (Scale 0-10; or none, mild, moderate, severe)     5 9. TETANUS: For any breaks in the skin, ask: When was your last tetanus booster?     UTD she believes 10. BLOOD THINNERS: Do you take any blood thinners?  (e.g., aspirin, clopidogrel / Plavix, coumadin, heparin). Notes: Other strong blood thinners include: Arixtra (fondaparinux), Eliquis (apixaban), Pradaxa (dabigatran), and Xarelto (rivaroxaban).       denies 11. OTHER SYMPTOMS: Do you have any other symptoms? (e.g., neck pain, vomiting)       Denies  Pt states she feels as though there is glass still in her head. Denies bleeding.  Protocols used: Head Injury-A-AH

## 2024-05-02 ENCOUNTER — Other Ambulatory Visit: Payer: Self-pay | Admitting: Family Medicine

## 2024-05-02 DIAGNOSIS — G8929 Other chronic pain: Secondary | ICD-10-CM

## 2024-06-01 ENCOUNTER — Ambulatory Visit: Payer: Self-pay

## 2024-06-01 NOTE — Telephone Encounter (Signed)
 FYI Only or Action Required?: FYI only for provider.  Patient was last seen in primary care on 02/13/2024 by Chandra Toribio POUR, MD.  Called Nurse Triage reporting Otalgia.  Symptoms began several days ago.  Interventions attempted: Nothing.  Symptoms are: gradually worsening.  Triage Disposition: See Physician Within 24 Hours  Patient/caregiver understands and will follow disposition?: Yes, will follow disposition  Copied from CRM [id number]. Topic: Clinical - Red Word Triage >> Jun 01, 2024 10:03 AM Kevelyn M wrote: Red Word that prompted transfer to Nurse Triage: possible ear infection in right ear, pain down to tooth/jaw area. For the last 3 days. Reason for Disposition  Earache  (Exceptions: Brief ear pain of lasting less than 60 minutes, or earache occurring during air travel.)  Answer Assessment - Initial Assessment Questions 1. LOCATION: Which ear is involved?     R ear 2. ONSET: When did the ear pain start?      3 days 3. SEVERITY: How bad is the pain?  (Scale 1-10; mild, moderate or severe)     7 at times, intermittent severity 4. URI SYMPTOMS: Do you have a runny nose or cough?     denies 5. FEVER: Do you have a fever? If Yes, ask: What is your temperature, how was it measured, and when did it start?     denies 6. CAUSE: Have you been swimming recently?, How often do you use Q-TIPS?, Have you had any recent air travel or scuba diving?     denies 7. OTHER SYMPTOMS: Do you have any other symptoms? (e.g., decreased hearing, dizziness, headache, stiff neck, vomiting)     Pain travels into the jaw, Denies N/V. Denies difficulty breathing.  Protocols used: Rilla

## 2024-06-02 ENCOUNTER — Encounter: Payer: Self-pay | Admitting: Family Medicine

## 2024-06-02 ENCOUNTER — Ambulatory Visit (INDEPENDENT_AMBULATORY_CARE_PROVIDER_SITE_OTHER): Admitting: Family Medicine

## 2024-06-02 VITALS — BP 104/65 | HR 65 | Ht 64.0 in | Wt 187.0 lb

## 2024-06-02 DIAGNOSIS — H9201 Otalgia, right ear: Secondary | ICD-10-CM | POA: Insufficient documentation

## 2024-06-02 MED ORDER — AMOXICILLIN 500 MG PO CAPS: 500.0000 mg | ORAL_CAPSULE | Freq: Three times a day (TID) | ORAL | 0 refills | Status: AC
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
## 2024-06-02 NOTE — Assessment & Plan Note (Signed)
 Right otalgia with cerumen impaction Presents with several days of right ear pain. History of Meniere's disease, which precludes ear irrigation. On examination, the right tympanic membrane could not be visualized due to cerumen impaction. An underlying otitis externa or media cannot be ruled out. - Will treat empirically for an ear infection. - If symptoms do not improve, will refer to ENT for further evaluation and management, including manual cerumen removal.

## 2024-06-02 NOTE — Patient Instructions (Signed)
 It was nice to see you today,  We addressed the following topics today: -I could not see your eardrum so I cannot rule out ear infection. - I will treat it empirically has an ear infection but if that does not help I would like you to let us  know and I can send in the referral to the ear nose and throat doctor.  Have a great day,  Rolan Slain, MD

## 2024-06-02 NOTE — Addendum Note (Signed)
 Addended by: CHANDRA TORIBIO POUR on: 06/02/2024 12:13 PM   Modules accepted: Orders, Level of Service

## 2024-06-06 ENCOUNTER — Telehealth: Admitting: Family Medicine

## 2024-06-06 DIAGNOSIS — R112 Nausea with vomiting, unspecified: Secondary | ICD-10-CM

## 2024-06-06 DIAGNOSIS — H6691 Otitis media, unspecified, right ear: Secondary | ICD-10-CM

## 2024-06-06 DIAGNOSIS — H8109 Meniere's disease, unspecified ear: Secondary | ICD-10-CM | POA: Diagnosis not present

## 2024-06-06 MED ORDER — MECLIZINE HCL 25 MG PO TABS: 25.0000 mg | ORAL_TABLET | Freq: Three times a day (TID) | ORAL | 0 refills | Status: AC | PRN

## 2024-06-06 MED ORDER — ONDANSETRON 4 MG PO TBDP: 4.0000 mg | ORAL_TABLET | Freq: Three times a day (TID) | ORAL | 0 refills | Status: DC | PRN

## 2024-06-06 NOTE — Patient Instructions (Signed)
Mnire's Disease  Mnire's disease is a condition that affects your inner ear. It often affects just one ear, but it can affect both. It's a lifelong condition that may get worse over time. But there are treatments that can help you manage your symptoms. What are the causes? In your inner ear, there's a fluid called endolymph. If that fluid builds up, it can affect the nerves that help you balance and hear. This can cause Mnire's disease. The reason for the fluid buildup isn't known. But it may be caused by: Allergies. A problem with your body's defense, or immune, system. An infection of your inner ear. A head injury. What increases the risk? You may be more likely to get Mnire's disease if: You're 88-70 years old. Someone else in your family has the condition. You have an autoimmune disease. This kind of disease attacks your immune system. You have a head injury. What are the signs or symptoms? Symptoms may include: A feeling of fullness or pressure in your ear. Tinnitus. This is a ringing or buzzing in your ear. Vertigo. This is when things feel like they're spinning when they're not. Dizziness or loss of balance. Hearing loss. Nausea and vomiting. This is rare. Symptoms may come and go. They may last for 20 minutes or for many hours at a time. They may get worse over time. How is this diagnosed? Mnire's disease may be diagnosed based on an exam and tests. Tests may include: A hearing test called an audiogram. An electronystagmogram (ENG). This tests the nerve that helps you balance. Imaging studies, such as MRI. Other balance tests. How is this treated? There's no cure. But treatment can help you manage your symptoms. You may need to: Take in less salt. Salt is also called sodium. A low-salt diet can reduce fluid in your body. This can help relieve symptoms. Take medicines. These may include medicines to help with: Vertigo. Nausea. How much fluid your body is holding  on to. Use an air pressure pulse generator. This is a machine that sends small pressure pulses into your ear. Get shots through your eardrum. These can help with vertigo. Wear hearing aids. Have inner ear surgery. This is rare. Have therapy to help you manage the stress of living with this condition. Follow these instructions at home: Eating and drinking Limit how much salt you take in as told by your health care provider. You may be told to limit your intake to 1,500-2,000 mg per day. Check foods and drinks to see how much salt is in them. Take in less caffeine. Try to avoid it if you can. Do not drink alcohol. Drink enough fluid to keep your pee (urine) pale yellow. General instructions Take over-the-counter and prescription medicines only as told by your provider. Find ways to reduce stress. These may include meditation, yoga, or deep breathing. Do not drive if you're dizzy or have vertigo. Do not use any products that contain nicotine or tobacco. These products include cigarettes, chewing tobacco, and vaping devices, such as e-cigarettes. If you need help quitting, ask your provider. Where to find more information American Academy of Otolaryngology-Head and Neck Surgery Foundation: enthealth.org American Training and development officer (AHRF): american-hearing.org Contact a health care provider if: Your symptoms last longer than 4 hours. You have new or worse symptoms. You've been vomiting for 24 hours. You can't keep fluids down. You can't manage the stress of living with Mnire's disease. Get help right away if: You have chest pain. You have trouble breathing.  These symptoms may be an emergency. Get help right away. Call 911. Do not wait to see if the symptoms will go away. Do not drive yourself to the hospital. This information is not intended to replace advice given to you by your health care provider. Make sure you discuss any questions you have with your health care  provider. Document Revised: 01/07/2023 Document Reviewed: 01/07/2023 Elsevier Patient Education  2024 [employer].

## 2024-06-06 NOTE — Progress Notes (Signed)
 Virtual Visit Consent   Angela Mcintyre, you are scheduled for a virtual visit with a [facility] provider today. Just as with appointments in the office, your consent must be obtained to participate. Your consent will be active for this visit and any virtual visit you may have with one of our providers in the next 365 days. If you have a MyChart account, a copy of this consent can be sent to you electronically.  As this is a virtual visit, video technology does not allow for your provider to perform a traditional examination. This may limit your provider's ability to fully assess your condition. If your provider identifies any concerns that need to be evaluated in person or the need to arrange testing (such as labs, EKG, etc.), we will make arrangements to do so. Although advances in technology are sophisticated, we cannot ensure that it will always work on either your end or our end. If the connection with a video visit is poor, the visit may have to be switched to a telephone visit. With either a video or telephone visit, we are not always able to ensure that we have a secure connection.  By engaging in this virtual visit, you consent to the provision of healthcare and authorize for your insurance to be billed (if applicable) for the services provided during this visit. Depending on your insurance coverage, you may receive a charge related to this service.  I need to obtain your verbal consent now. Are you willing to proceed with your visit today? Angela Mcintyre has provided verbal consent on 06/06/2024 for a virtual visit (video or telephone). [name], FNP  Date: 06/06/2024 10:02 AM   Virtual Visit via Video Note   I, [name], connected with  Angela Mcintyre  ([id number], 1956/03/19) on 06/06/24 at 10:00 AM EDT by a video-enabled telemedicine application and verified that I am speaking with the correct person using two identifiers.  Location: Patient: Virtual Visit Location Patient:  Home Provider: Virtual Visit Location Provider: Home Office   I discussed the limitations of evaluation and management by telemedicine and the availability of in person appointments. The patient expressed understanding and agreed to proceed.    History of Present Illness: Angela Mcintyre is a 68 y.o. who identifies as a female who was assigned female at birth, and is being seen today for rt ear infection treated this week with antibiotics. Now her menieres is flaring up and she requests a refill on ativan .   HPI: HPI  Problems:  Patient Active Problem List   Diagnosis Date Noted   Acute otalgia, right 06/02/2024   Hyperlipidemia associated with type 2 diabetes mellitus (HCC) 02/13/2024   Chronic sciatica of right side 02/13/2024   Estrogen deficiency 12/30/2021   Meniere's disease 12/30/2021   Generalized anxiety disorder 12/30/2021   Chronic right shoulder pain 09/18/2021   Hypertension associated with diabetes (HCC) 09/18/2021   Type 2 diabetes mellitus without complication, without long-term current use of insulin (HCC) 09/18/2021   Thyroid  nodule 09/18/2021   Chronic obstructive pulmonary disease (HCC) 09/18/2021   Body mass index (BMI) of 37.0-37.9 in adult 09/18/2021    Allergies:  Allergies  Allergen Reactions   Gabapentin Swelling   Medications:  Current Outpatient Medications:    albuterol  (PROVENTIL ) (2.5 MG/3ML) 0.083% nebulizer solution, Use 1 vial nebulized every 6 hours as needed for shortness of breath, Disp: 75 mL, Rfl: 12   albuterol  (VENTOLIN  HFA) 108 (90 Base) MCG/ACT inhaler, Inhale 2 puffs  into the lungs every 6 (six) hours as needed for wheezing., Disp: 2 each, Rfl: 11   amoxicillin  (AMOXIL ) 500 MG capsule, Take 1 capsule (500 mg total) by mouth 3 (three) times daily for 7 days., Disp: 21 capsule, Rfl: 0   cyclobenzaprine  (FLEXERIL ) 10 MG tablet, TAKE 1 TABLET BY MOUTH AT BEDTIME AS NEEDED FOR MUSCLE PAIN/ SPASMS, Disp: 30 tablet, Rfl: 4    Fluticasone -Umeclidin-Vilant (TRELEGY ELLIPTA ) 100-62.5-25 MCG/ACT AEPB, Inhale 1 puff into the lungs daily., Disp: 60 each, Rfl: 11   Glucose Blood (RELION TRUE METRIX TEST STRIPS VI), 1 Stick by In Vitro route in the morning, at noon, and at bedtime., Disp: , Rfl:    glucose blood (RELION TRUE METRIX TEST STRIPS) test strip, Test blood sugar 3 times daily, Disp: 100 each, Rfl: 12   hydrochlorothiazide  (HYDRODIURIL ) 25 MG tablet, TAKE 1 TABLET (25 MG TOTAL) BY MOUTH DAILY., Disp: 30 tablet, Rfl: 4   LORazepam  (ATIVAN ) 0.5 MG tablet, Take 1 tablet (0.5 mg total) by mouth at bedtime., Disp: 30 tablet, Rfl: 2   metoprolol  tartrate (LOPRESSOR ) 25 MG tablet, TAKE 1 TABLET BY MOUTH 2 TIMES DAILY WITH A MEAL., Disp: 60 tablet, Rfl: 4   OZEMPIC , 0.25 OR 0.5 MG/DOSE, 2 MG/3ML SOPN, INJECT 0.5 MG INTO THE SKIN ONCE A WEEK., Disp: 3 mL, Rfl: 2   rosuvastatin  (CRESTOR ) 5 MG tablet, TAKE 1 TABLET (5 MG TOTAL) BY MOUTH DAILY., Disp: 30 tablet, Rfl: 4   sertraline  (ZOLOFT ) 100 MG tablet, Take 1.5 tablets (150 mg total) by mouth daily., Disp: 45 tablet, Rfl: 2  Observations/Objective: Patient is well-developed, well-nourished in no acute distress.  Resting comfortably  at home.  Head is normocephalic, atraumatic.  No labored breathing.  Speech is clear and coherent with logical content.  Patient is alert and oriented at baseline.    Assessment and Plan: 1. Meniere's disease, unspecified laterality (Primary)  2. Right otitis media, unspecified otitis media type  Explained we are not able to provide any controlled substances. She will need to be seen in ED or urgent care for that however I can send her meclizine  and zofran . She agrees.   Follow Up Instructions: I discussed the assessment and treatment plan with the patient. The patient was provided an opportunity to ask questions and all were answered. The patient agreed with the plan and demonstrated an understanding of the instructions.  A copy of  instructions were sent to the patient via MyChart unless otherwise noted below.     The patient was advised to call back or seek an in-person evaluation if the symptoms worsen or if the condition fails to improve as anticipated.    Koraline Phillipson, FNP

## 2024-06-09 ENCOUNTER — Other Ambulatory Visit: Payer: Self-pay | Admitting: Family Medicine

## 2024-06-09 DIAGNOSIS — I10 Essential (primary) hypertension: Secondary | ICD-10-CM

## 2024-06-11 ENCOUNTER — Ambulatory Visit: Payer: Self-pay | Admitting: *Deleted

## 2024-06-11 NOTE — Telephone Encounter (Signed)
 FYI Only or Action Required?: Action required by provider: referral request, update on patient condition, and please advise if appt tomorrow last OV 06/02/24 and started on antibiotics.  Patient was last seen in primary care on 06/06/2024 by Blair, Diane W, FNP.  Called Nurse Triage reporting Diarrhea.  Symptoms began several days ago.  Interventions attempted: Prescription medications: antibiotics , drinking water.  Symptoms are: gradually worsening.  Triage Disposition: See Physician Within 24 Hours  Patient/caregiver understands and will follow disposition?: Yes              Copied from CRM [id number]. Topic: Clinical - Red Word Triage >> Jun 11, 2024  8:10 AM Berneda FALCON wrote: Red Word that prompted transfer to Nurse Triage: Pt initially called in to request a ENT referral for pain in her right ear (which I did put the request in for this). Additionally,  patient states she has had diarrhea for 5 days and has lost 14lbs in the past 4 days. She is worried about this and is quite upset about it. States this is common for her condition but she is having a hard time eating. Reason for Disposition  [1] SEVERE diarrhea (e.g., 7 or more times / day more than normal) AND [2] present > 24 hours (1 day)  Answer Assessment - Initial Assessment Questions Requesting ENT referral for right ear pain . Started on antibiotics and now diarrhea worsening x 5 days. Lost 14 lbs in 4 days. Appt scheduled for tomorrow with Provider. Recommended in sx worsen go to ED. Dizziness chronic per patient, dry mouth , can stand, no abdominal pain . No vomiting. No fever      1. DIARRHEA SEVERITY: How bad is the diarrhea? How many more stools have you had in the past 24 hours than normal?      Getting worse since taking antibiotics 2. ONSET: When did the diarrhea begin?      5 days ago  3. STOOL DESCRIPTION:  How loose or watery is the diarrhea? What is the stool color? Is there any blood or  mucous in the stool?     Loose and watery 4. VOMITING: Are you also vomiting? If Yes, ask: How many times in the past 24 hours?      no 5. ABDOMEN PAIN: Are you having any abdomen pain? If Yes, ask: What does it feel like? (e.g., crampy, dull, intermittent, constant)      no 6. ABDOMEN PAIN SEVERITY: If present, ask: How bad is the pain?  (e.g., Scale 1-10; mild, moderate, or severe)     na 7. ORAL INTAKE: If vomiting, Have you been able to drink liquids? How much liquids have you had in the past 24 hours?     Able to tolerate water 8. HYDRATION: Any signs of dehydration? (e.g., dry mouth [not just dry lips], too weak to stand, dizziness, new weight loss) When did you last urinate?     Dizziness chronic per patient ,  dry mouth can stand no issues 9. EXPOSURE: Have you traveled to a foreign country recently? Have you been exposed to anyone with diarrhea? Could you have eaten any food that was spoiled?     na 10. ANTIBIOTIC USE: Are you taking antibiotics now or have you taken antibiotics in the past 2 months?       Yes  11. OTHER SYMPTOMS: Do you have any other symptoms? (e.g., fever, blood in stool)       Diarrhea since started antibiotics. Lost  14 lbs in 4 days. Right ear pain  12. PREGNANCY: Is there any chance you are pregnant? When was your last menstrual period?       na  Protocols used: [facility]

## 2024-06-11 NOTE — Telephone Encounter (Unsigned)
 Copied from CRM #[id number]. Topic: Referral - Request for Referral >> Jun 11, 2024  8:09 AM Berneda FALCON wrote: Did the patient discuss referral with their provider in the last year? Yes (If No - schedule appointment) (If Yes - send message)  Appointment offered? No, she already spoke to PCP about it  Type of order/referral and detailed reason for visit: Patient was  on ear medication for right ear, and this has not been resolved.  Preference of office, provider, location: No preference  If referral order, have you been seen by this specialty before? No (If Yes, this issue or another issue? When? Where?  Can we respond through MyChart? Yes

## 2024-06-12 ENCOUNTER — Ambulatory Visit (INDEPENDENT_AMBULATORY_CARE_PROVIDER_SITE_OTHER): Admitting: Family Medicine

## 2024-06-12 ENCOUNTER — Encounter: Payer: Self-pay | Admitting: Family Medicine

## 2024-06-12 VITALS — BP 129/82 | HR 60 | Ht 64.0 in | Wt 180.1 lb

## 2024-06-12 DIAGNOSIS — E119 Type 2 diabetes mellitus without complications: Secondary | ICD-10-CM | POA: Diagnosis not present

## 2024-06-12 DIAGNOSIS — Z7985 Long-term (current) use of injectable non-insulin antidiabetic drugs: Secondary | ICD-10-CM | POA: Diagnosis not present

## 2024-06-12 DIAGNOSIS — H8109 Meniere's disease, unspecified ear: Secondary | ICD-10-CM | POA: Diagnosis not present

## 2024-06-12 MED ORDER — BLOOD GLUCOSE MONITORING SUPPL DEVI: 1.0000 | Freq: Three times a day (TID) | 0 refills | Status: AC

## 2024-06-12 MED ORDER — LANCETS MISC. MISC: 1.0000 | Freq: Every morning | 0 refills | Status: AC

## 2024-06-12 MED ORDER — BLOOD GLUCOSE TEST VI STRP
1.0000 | ORAL_STRIP | 3 refills | Status: AC
Start: 2024-06-12 — End: 2025-07-17

## 2024-06-12 MED ORDER — LANCET DEVICE MISC: 1.0000 | Freq: Every morning | 0 refills | Status: AC

## 2024-06-12 NOTE — Assessment & Plan Note (Signed)
 In need of new blood glucose monitoring supplies. Checks blood sugar three times per week. Missed this week's injection and plans to resume on the next scheduled day. - Send new prescription for Accu-Check monitor, test strips, lancets, and needles.

## 2024-06-12 NOTE — Progress Notes (Signed)
   Acute Office Visit  Subjective:     Patient ID: Angela Mcintyre, female    DOB: 1956/08/16, 68 y.o.   MRN: [id number]  Chief Complaint  Patient presents with   Diarrhea    HPI Patient is in today for   Subjective - Reports a Meniere's disease flare for the last 4 days, triggered by coughing. The flare includes associated diarrhea. - Requests a referral to an Ear, Nose, and Throat (ENT) specialist for ongoing ear cleaning, as previous attempts in-office were unsuccessful and triggered symptoms. - Requests a new prescription for a blood glucose monitor and testing strips (Accu-Check) as their current supplies are not covered by insurance. - Reports a 5-pound weight gain from yesterday to today (175 lbs to 180 lbs) despite not eating, though notes their home scale may be inaccurate. Last meal was cup of noodles last night. Reports no hunger.  Medications Lorazepam  (prescribed by psychiatrist, to be filled tomorrow) for Meniere's flares and anxiety, meclizine  (ineffective), an unspecified weekly injectable medication for diabetes (missed this week's dose, will resume on Tuesday).  PMH, PSH, FH, Social Hx PMHx: Meniere's disease, diabetes mellitus.  ROS HEENT: Reports ear sensitivity. GI: Positive for diarrhea. Negative for hunger. Psych: Reports increased anxiety/nerves.   ROS      Objective:    BP 129/82   Pulse 60   Ht 5' 4 (1.626 m)   Wt 180 lb 1.9 oz (81.7 kg)   SpO2 99%   BMI 30.92 kg/m    Physical Exam Gen: alert, oriented Pulm: no resp distress Psych: pleasant affect  No results found for any visits on 06/12/24.      Assessment & Plan:   Meniere's disease, unspecified laterality Assessment & Plan: Meniere's Disease Flare: Experiencing a 4-day flare with associated diarrhea, reportedly triggered by a previous attempt at cerumen removal. Patient reports lorazepam  is effective for flares but will not have access to it until tomorrow. Meclizine  is not  helpful. - Refer to ENT for evaluation and management, including regular ear cleaning. - Continue current management for flares as directed by psychiatry.  Orders: -     Ambulatory referral to ENT  Type 2 diabetes mellitus without complication, without long-term current use of insulin (HCC) Assessment & Plan: In need of new blood glucose monitoring supplies. Checks blood sugar three times per week. Missed this week's injection and plans to resume on the next scheduled day. - Send new prescription for Accu-Check monitor, test strips, lancets, and needles.   Other orders -     Blood Glucose Monitoring Suppl; 1 each by Does not apply route in the morning, at noon, and at bedtime. May substitute to any manufacturer covered by patient's insurance.  Dispense: 1 each; Refill: 0 -     Blood Glucose Test; 1 each by In Vitro route every morning. May substitute to any manufacturer covered by patient's insurance.  Dispense: 100 strip; Refill: 3 -     Lancet Device; 1 each by Does not apply route in the morning. May substitute to any manufacturer covered by patient's insurance.  Dispense: 1 each; Refill: 0 -     Lancets Misc.; 1 each by Does not apply route in the morning. May substitute to any manufacturer covered by patient's insurance.  Dispense: 100 each; Refill: 0     Return if symptoms worsen or fail to improve.  Angela MARLA Slain, MD

## 2024-06-12 NOTE — Assessment & Plan Note (Signed)
 Meniere's Disease Flare: Experiencing a 4-day flare with associated diarrhea, reportedly triggered by a previous attempt at cerumen removal. Patient reports lorazepam  is effective for flares but will not have access to it until tomorrow. Meclizine  is not helpful. - Refer to ENT for evaluation and management, including regular ear cleaning. - Continue current management for flares as directed by psychiatry.

## 2024-06-12 NOTE — Patient Instructions (Signed)
 It was nice to see you today,  We addressed the following topics today: -I am sending in a referral to the ear nose and throat doctor.  If you have not heard from somebody in 2 weeks let us  know. - I am sending in your glucose monitoring supplies.  Let us  know if you have any issues getting these. - Follow-up at your neck scheduled visit in November with kara.  Have a great day,  Rolan Slain, MD

## 2024-06-19 ENCOUNTER — Other Ambulatory Visit: Payer: Self-pay | Admitting: Family Medicine

## 2024-06-19 DIAGNOSIS — Z1231 Encounter for screening mammogram for malignant neoplasm of breast: Secondary | ICD-10-CM

## 2024-06-25 ENCOUNTER — Ambulatory Visit: Admitting: Internal Medicine

## 2024-06-25 ENCOUNTER — Encounter: Payer: Self-pay | Admitting: Internal Medicine

## 2024-06-25 VITALS — BP 124/80 | HR 55 | Ht 64.0 in | Wt 195.0 lb

## 2024-06-25 DIAGNOSIS — E785 Hyperlipidemia, unspecified: Secondary | ICD-10-CM | POA: Diagnosis not present

## 2024-06-25 DIAGNOSIS — E041 Nontoxic single thyroid nodule: Secondary | ICD-10-CM

## 2024-06-25 DIAGNOSIS — E119 Type 2 diabetes mellitus without complications: Secondary | ICD-10-CM

## 2024-06-25 LAB — POCT GLYCOSYLATED HEMOGLOBIN (HGB A1C): Hemoglobin A1C: 5.8 % — AB (ref 4.0–5.6)

## 2024-06-25 LAB — POCT GLUCOSE (DEVICE FOR HOME USE): POC Glucose: 117 mg/dL — AB (ref 70–99)

## 2024-06-25 MED ORDER — OZEMPIC (0.25 OR 0.5 MG/DOSE) 2 MG/3ML ~~LOC~~ SOPN: 0.5000 mg | PEN_INJECTOR | SUBCUTANEOUS | 3 refills | Status: AC

## 2024-06-25 NOTE — Patient Instructions (Signed)
Continue Ozempic 0.5 mg weekly

## 2024-06-25 NOTE — Progress Notes (Signed)
 Name: Angela Mcintyre  MRN/ DOB: [id number], 10/24/1955   Age/ Sex: 68 y.o., female    PCP: Gayle Saddie JULIANNA DEVONNA   Reason for Endocrinology Evaluation: Type 2 Diabetes Mellitus     Date of Initial Endocrinology Visit: 06/05/2022    PATIENT IDENTIFIER: Angela Mcintyre is a 67 y.o. female with a past medical history of DM, Right thyroid  nodules, HTN and COPD . The patient presented for initial endocrinology clinic visit on 06/05/2022  for consultative assistance with her diabetes management.    HPI: Angela Mcintyre was    Diagnosed with DM 2022 Prior Medications tried/Intolerance: Metformin- deathly sick . Started Ozempic  04/2022              Hemoglobin A1c has ranged from 6.9% in 12/2021, peaking at 7.9% in 04/2022.  On her initial visit to our clinic, her A1c was 7.9%, she was on Ozempic  only which we increased  THYROID  HISTORY: Patient was noted without right thyroid  nodule on physical exam which prompted thyroid  ultrasound showing a right  3.9 cm nodule meeting FNA criteria on 12/27/2021 She has a hx of FNA ~ 2 yrs ago while living in [place]    No Fh of thyroid  disease Denies radiation exposure nor neck sx   She is s/p right 3.9 cm thyroid  nodule 07/12/2022 with benign cytology  SUBJECTIVE:   During the last visit (02/04/2023): A1c 7.9%     Today (06/25/24): Angela Mcintyre is here for follow-up on diabetes management and right thyroid  nodule.  She has NOT been to our clinic in 17 months.  She checks her blood sugars occasionally.   The patient has been diagnosed with Mnire's disease, she was referred to ENT Patient follows with behavioral health  Denies nausea  Has rare loose stools  No local neck swelling  No palpitations unless with exertion  No tremors unless with anxiety   HOME DIABETES REGIMEN: Ozempic  0.5 mg weekly    Statin: No ACE-I/ARB: No   METER DOWNLOAD SUMMARY: n/a    DIABETIC COMPLICATIONS: Microvascular complications:   Denies: neuropathy,  CKD  Last eye exam: Completed 08/29/2023   Macrovascular complications:   Denies: CAD, PVD, CVA   PAST HISTORY: Past Medical History:  Past Medical History:  Diagnosis Date   High blood pressure    Past Surgical History:  Past Surgical History:  Procedure Laterality Date   CESAREAN SECTION  1973   CESAREAN SECTION      Social History:  reports that she has quit smoking. Her smoking use included cigarettes. She has been exposed to tobacco smoke. She has never used smokeless tobacco. She reports that she does not currently use alcohol. She reports that she does not currently use drugs. Family History:  Family History  Problem Relation Age of Onset   Breast cancer Neg Hx      HOME MEDICATIONS: Allergies as of 06/25/2024       Reactions   Gabapentin Swelling        Medication List        Accurate as of June 25, 2024  8:52 AM. If you have any questions, ask your nurse or doctor.          albuterol  (2.5 MG/3ML) 0.083% nebulizer solution Commonly known as: PROVENTIL  Use 1 vial nebulized every 6 hours as needed for shortness of breath   albuterol  108 (90 Base) MCG/ACT inhaler Commonly known as: VENTOLIN  HFA Inhale 2 puffs into the lungs every 6 (six) hours as needed for  wheezing.   Blood Glucose Monitoring Suppl Devi 1 each by Does not apply route in the morning, at noon, and at bedtime. May substitute to any manufacturer covered by patient's insurance.   BLOOD GLUCOSE TEST STRIPS Strp 1 each by In Vitro route every morning. May substitute to any manufacturer covered by patient's insurance.   cyclobenzaprine  10 MG tablet Commonly known as: FLEXERIL  TAKE 1 TABLET BY MOUTH AT BEDTIME AS NEEDED FOR MUSCLE PAIN/ SPASMS   hydrochlorothiazide  25 MG tablet Commonly known as: HYDRODIURIL  TAKE 1 TABLET (25 MG TOTAL) BY MOUTH DAILY.   Lancet Device Misc 1 each by Does not apply route in the morning. May substitute to any manufacturer covered by patient's  insurance.   Lancets Misc. Misc 1 each by Does not apply route in the morning. May substitute to any manufacturer covered by patient's insurance.   LORazepam  0.5 MG tablet Commonly known as: ATIVAN  Take 1 tablet (0.5 mg total) by mouth at bedtime.   meclizine  25 MG tablet Commonly known as: ANTIVERT  Take 1 tablet (25 mg total) by mouth 3 (three) times daily as needed for dizziness.   metoprolol  tartrate 25 MG tablet Commonly known as: LOPRESSOR  TAKE 1 TABLET BY MOUTH 2 TIMES DAILY WITH A MEAL.   ondansetron  4 MG disintegrating tablet Commonly known as: ZOFRAN -ODT Take 1 tablet (4 mg total) by mouth every 8 (eight) hours as needed for nausea or vomiting.   Ozempic  (0.25 or 0.5 MG/DOSE) 2 MG/3ML Sopn Generic drug: Semaglutide (0.25 or 0.5MG /DOS) INJECT 0.5 MG INTO THE SKIN ONCE A WEEK.   rosuvastatin  5 MG tablet Commonly known as: CRESTOR  TAKE 1 TABLET (5 MG TOTAL) BY MOUTH DAILY.   sertraline  100 MG tablet Commonly known as: ZOLOFT  Take 1.5 tablets (150 mg total) by mouth daily.   Trelegy Ellipta  100-62.5-25 MCG/ACT Aepb Generic drug: Fluticasone -Umeclidin-Vilant Inhale 1 puff into the lungs daily.         ALLERGIES: Allergies  Allergen Reactions   Gabapentin Swelling     REVIEW OF SYSTEMS: A comprehensive ROS was conducted with the patient and is negative except as per HPI    OBJECTIVE:   VITAL SIGNS: BP 124/80 (BP Location: Left Arm, Patient Position: Sitting, Cuff Size: Normal)   Pulse (!) 55   Ht 5' 4 (1.626 m)   Wt 195 lb (88.5 kg)   SpO2 99%   BMI 33.47 kg/m    PHYSICAL EXAM:  General: Pt appears well and is in NAD  Neck: General: Right thyroid  nodule appreciated  Lungs: Clear with good BS bilat   Heart: RRR   Extremities:  Lower extremities - No pretibial edema  Neuro: MS is good with appropriate affect, pt is alert and Ox3    DM foot exam: 06/25/2024  The skin of the feet is intact without sores or ulcerations. The pedal pulses are 2+  on right and 2+ on left. The sensation is intact to a screening 5.07, 10 gram monofilament bilaterally   DATA REVIEWED:  Lab Results  Component Value Date   HGBA1C 5.9 (H) 01/24/2024   HGBA1C 6.0 (H) 08/12/2023   HGBA1C 6.1 (H) 04/08/2023    Latest Reference Range & Units 01/24/24 09:04  Sodium 134 - 144 mmol/L 141  Potassium 3.5 - 5.2 mmol/L 4.2  Chloride 96 - 106 mmol/L 100  CO2 20 - 29 mmol/L 25  Glucose 70 - 99 mg/dL 99  BUN 8 - 27 mg/dL 8  Creatinine 9.42 - 8.99 mg/dL 9.22  Calcium  8.7 -  10.3 mg/dL 9.5  BUN/Creatinine Ratio 12 - 28  10 (L)  eGFR >59 mL/min/1.73 84  Alkaline Phosphatase 44 - 121 IU/L 114  Albumin 3.9 - 4.9 g/dL 4.6  AST 0 - 40 IU/L 15  ALT 0 - 32 IU/L 12  Total Protein 6.0 - 8.5 g/dL 7.3  Total Bilirubin 0.0 - 1.2 mg/dL 0.6  Total CHOL/HDL Ratio 0.0 - 4.4 ratio 5.0 (H)  Cholesterol, Total 100 - 199 mg/dL 770 (H)  HDL Cholesterol >39 mg/dL 46  MICROALB/CREAT RATIO 0 - 29 mg/g creat 7  Triglycerides 0 - 149 mg/dL 96  VLDL Cholesterol Cal 5 - 40 mg/dL 17  LDL Chol Calc (NIH) 0 - 99 mg/dL 833 (H)    Latest Reference Range & Units 01/24/24 09:04  Microalbumin, Urine Not Estab. ug/mL 10.7  MICROALB/CREAT RATIO 0 - 29 mg/g creat 7  Creatinine, Urine Not Estab. mg/dL 846.1      Thyroid  ultrasound 04/01/2023 FINDINGS: Parenchymal Echotexture: Mildly heterogenous   Isthmus: 0.5 cm   Right lobe: 4.5 x 2.1 x 3.3 cm   Left lobe: 3.8 x 1.1 x 0.9 cm   _________________________________________________________   Estimated total number of nodules >/= 1 cm: 2   Number of spongiform nodules >/=  2 cm not described below (TR1): 0   Number of mixed cystic and solid nodules >/= 1.5 cm not described below (TR2): 0   _________________________________________________________   Nodule labeled 1 is a poorly characterized but possible solid isoechoic TR 3 nodule versus pseudo nodule in the superior right thyroid  lobe measuring up to 1.0 cm. Given size (<1.4  cm) and appearance, this nodule does NOT meet TI-RADS criteria for biopsy or dedicated follow-up.   Nodule labeled 2 is a previously described and biopsied large and heterogeneous nodule occupying the mid and inferior portion of the right thyroid  lobe measuring up to 3.7 cm on this exam, previously 3.9 cm in maximum dimension. It overall remains similar in size and morphology.   IMPRESSION: Similar appearance of previously biopsied large nodule in the right thyroid  lobe (labeled 2 on this exam, previously 1). No new thyroid  nodules that meet criteria for further dedicated follow-up or biopsy.    FNA right thyroid  nodule 07/12/2022  Clinical History: Nodule #1: Right; Mid, Maximum size: 3.9 cm; Other 2  dimensions: 2.8 x 2.6 cm, solid/almost completely solid, hypoechoic,  Echogenic foci: macrocalcifications: TI-RADS total points: 5.  Specimen Submitted:  A. THYROID , RT MID, FINE NEEDLE ASPIRATION:    FINAL MICROSCOPIC DIAGNOSIS:  - Consistent with benign follicular nodule (Bethesda category II)     In office BG 117 MGs/DL   ASSESSMENT / PLAN / RECOMMENDATIONS:   1) Type 2 Diabetes Mellitus, suboptimally controlled, With out at complications - Most recent A1c of 5.8 %. Goal A1c < 7.0 %.    -A1c at goal -She is intolerant to metformin -Intolerant to higher doses of Ozempic   - No changes at this time   MEDICATIONS: Continue Ozempic  0.5 mg weekly  EDUCATION / INSTRUCTIONS: BG monitoring instructions: Patient is instructed to check her blood sugars 2-3 times a week  2) Diabetic complications:  Eye: Does not have known diabetic retinopathy.  Neuro/ Feet: Does not have known diabetic peripheral neuropathy. Renal: Patient does not have known baseline CKD. She is not on an ACEI/ARB at present.   3) Right Thyroid  Nodule :  -Patient is clinically euthyroid -No local neck symptoms -Per patient she has history of right thyroid  nodule FNA a few years  ago while living in  [place]  with an assumption of benign cytology (records not available) -She is s/p FNA of the right thyroid  nodule 3.9 cm with benign cytology in 2023 - Repeat ultrasound in June, 2024 showed stability - Will repeat ultrasound again this year  4) Dyslipidemia :  - LDL elevated at 166 MGs/DL, prior to that she did have a prescription for rosuvastatin  but she was not taking it until 2 months ago -She has been consistent with taking rosuvastatin  in the past few months, we discussed cardiovascular benefits of statin intake   Medication Rosuvastatin  5 mg daily   Follow-up 6 months   Signed electronically by: Stefano Redgie Butts, MD  [facility] Endocrinology  [facility] [address] [place] [place], [place] [address] Phone: [phone] FAX: [phone]   CC: Gayle Saddie JULIANNA DEVONNA [address] [place] [address] Phone: [phone]  Fax: [phone]    Return to Endocrinology clinic as below: Future Appointments  Date Time Provider Department Center  06/25/2024  9:10 AM Donzella Carrol, Donell Redgie, MD [facility] None  06/26/2024  8:30 AM [facility] MM 3 [facility]  07/13/2024  2:20 PM Arfeen, Leni DASEN, MD [facility] None  08/18/2024  8:10 AM Gayle Saddie JULIANNA DEVONNA [facility]  04/08/2025  8:10 AM [facility]-ANNUAL WELLNESS VISIT [facility]

## 2024-06-26 ENCOUNTER — Ambulatory Visit
Admission: RE | Admit: 2024-06-26 | Discharge: 2024-06-26 | Disposition: A | Discharge status: Home / Self Care | Source: Ambulatory Visit | Attending: Family Medicine | Admitting: Family Medicine

## 2024-06-26 DIAGNOSIS — Z1231 Encounter for screening mammogram for malignant neoplasm of breast: Secondary | ICD-10-CM

## 2024-06-29 ENCOUNTER — Ambulatory Visit
Admission: RE | Admit: 2024-06-29 | Discharge: 2024-06-29 | Disposition: A | Discharge status: Home / Self Care | Source: Ambulatory Visit | Attending: Internal Medicine | Admitting: Internal Medicine

## 2024-06-29 DIAGNOSIS — E041 Nontoxic single thyroid nodule: Secondary | ICD-10-CM

## 2024-07-03 ENCOUNTER — Ambulatory Visit: Payer: Self-pay | Admitting: Internal Medicine

## 2024-07-06 ENCOUNTER — Other Ambulatory Visit: Payer: Self-pay | Admitting: Family Medicine

## 2024-07-06 DIAGNOSIS — I152 Hypertension secondary to endocrine disorders: Secondary | ICD-10-CM

## 2024-07-13 ENCOUNTER — Telehealth (HOSPITAL_COMMUNITY): Admitting: Psychiatry

## 2024-07-13 ENCOUNTER — Encounter (HOSPITAL_COMMUNITY): Payer: Self-pay | Admitting: Psychiatry

## 2024-07-13 VITALS — Wt 195.0 lb

## 2024-07-13 DIAGNOSIS — F418 Other specified anxiety disorders: Secondary | ICD-10-CM | POA: Diagnosis not present

## 2024-07-13 DIAGNOSIS — F4310 Post-traumatic stress disorder, unspecified: Secondary | ICD-10-CM | POA: Diagnosis not present

## 2024-07-13 DIAGNOSIS — H8109 Meniere's disease, unspecified ear: Secondary | ICD-10-CM

## 2024-07-13 MED ORDER — LORAZEPAM 0.5 MG PO TABS: 0.5000 mg | ORAL_TABLET | Freq: Every day | ORAL | 2 refills | Status: DC

## 2024-07-13 MED ORDER — SERTRALINE HCL 100 MG PO TABS: 150.0000 mg | ORAL_TABLET | Freq: Every day | ORAL | 2 refills | Status: DC
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
## 2024-08-18 ENCOUNTER — Ambulatory Visit: Admitting: Family Medicine

## 2024-08-18 ENCOUNTER — Ambulatory Visit (INDEPENDENT_AMBULATORY_CARE_PROVIDER_SITE_OTHER)

## 2024-08-18 VITALS — BP 121/73 | HR 60 | Temp 98.0°F | Ht 64.0 in | Wt 195.1 lb

## 2024-08-18 DIAGNOSIS — Z1212 Encounter for screening for malignant neoplasm of rectum: Secondary | ICD-10-CM

## 2024-08-18 DIAGNOSIS — H00015 Hordeolum externum left lower eyelid: Secondary | ICD-10-CM | POA: Insufficient documentation

## 2024-08-18 DIAGNOSIS — E119 Type 2 diabetes mellitus without complications: Secondary | ICD-10-CM

## 2024-08-18 DIAGNOSIS — Z1211 Encounter for screening for malignant neoplasm of colon: Secondary | ICD-10-CM

## 2024-08-18 DIAGNOSIS — I152 Hypertension secondary to endocrine disorders: Secondary | ICD-10-CM | POA: Diagnosis not present

## 2024-08-18 DIAGNOSIS — M545 Low back pain, unspecified: Secondary | ICD-10-CM | POA: Insufficient documentation

## 2024-08-18 DIAGNOSIS — E1169 Type 2 diabetes mellitus with other specified complication: Secondary | ICD-10-CM

## 2024-08-18 DIAGNOSIS — E1159 Type 2 diabetes mellitus with other circulatory complications: Secondary | ICD-10-CM | POA: Diagnosis not present

## 2024-08-18 DIAGNOSIS — J449 Chronic obstructive pulmonary disease, unspecified: Secondary | ICD-10-CM

## 2024-08-18 DIAGNOSIS — Z7985 Long-term (current) use of injectable non-insulin antidiabetic drugs: Secondary | ICD-10-CM

## 2024-08-18 DIAGNOSIS — F411 Generalized anxiety disorder: Secondary | ICD-10-CM

## 2024-08-18 DIAGNOSIS — E785 Hyperlipidemia, unspecified: Secondary | ICD-10-CM

## 2024-08-18 MED ORDER — ERYTHROMYCIN 5 MG/GM OP OINT: 1.0000 | TOPICAL_OINTMENT | Freq: Two times a day (BID) | OPHTHALMIC | 0 refills | Status: AC

## 2024-08-18 NOTE — Patient Instructions (Signed)
 VISIT SUMMARY: During your visit, we discussed your diabetes, hypertension, hyperlipidemia, mental health, musculoskeletal symptoms, and an eye issue. We reviewed your medications and made some adjustments to your treatment plan.  YOUR PLAN: ACUTE LEFT EYELID STY: You have a painful sty on your left eyelid that has not improved with warm compresses. -Apply erythromycin ointment to the affected area twice daily.  ACUTE LOW BACK PAIN AFTER FALL: You have back pain from a recent fall, which is improving. -Continue using Advil  and Flexeril  as needed for pain. -If the pain persists beyond two weeks, we will reassess and consider imaging.  HYPERTENSION: Your blood pressure readings at home have been variable, with some low readings. -Continue monitoring your blood pressure at home. -Contact the clinic if your blood pressure remains consistently low for possible medication adjustment.  HYPERLIPIDEMIA: Your cholesterol levels are being managed with medication and dietary monitoring. -We will order a cholesterol panel to check your levels. -If your cholesterol is still elevated, we will reassess in a couple of months before considering a dose increase.  CHRONIC OBSTRUCTIVE PULMONARY DISEASE (COPD): Your COPD is being managed with Trelegy inhaler and albuterol  as needed. -Continue using your Trelegy inhaler and albuterol  as needed.  DEPRESSION: Your depression is being managed with Zoloft  and lorazepam , and you have no issues with efficacy or side effects. -Continue taking Zoloft  150 mg and lorazepam  as prescribed.  GENERAL HEALTH MAINTENANCE: You are due for colorectal cancer screening and are up to date with your flu vaccination. -We will order a Cologuard test for colorectal cancer screening.  If you have any problems before your next visit feel free to message me via MyChart (minor issues or questions) or call the office, otherwise you may reach out to schedule an office visit.  Thank  you! Saddie Sacks, PA-C

## 2024-08-18 NOTE — Assessment & Plan Note (Signed)
 On Ozempic  and doing well. Most recent A1c 5.8. Continue regular follow ups with endocrinology. UACR, foot and eye exam all UTD.

## 2024-08-18 NOTE — Assessment & Plan Note (Signed)
 Acute low back pain post-fall, improving. No imaging unless pain persists beyond four weeks. - Use Advil  and Flexeril  as needed for pain. - Reassess in two weeks if pain persists for possible imaging.

## 2024-08-18 NOTE — Assessment & Plan Note (Signed)
 Managed with Zoloft  150 mg and lorazepam . No issues with efficacy or side effects. No suicidal ideation confirmed. - Follows with psychiatry - Dr. Arfeen

## 2024-08-18 NOTE — Progress Notes (Signed)
 Established Patient Office Visit  Subjective   Patient ID: Angela Mcintyre, female    DOB: 07-Apr-1956  Age: 68 y.o. MRN: [id number]  Chief Complaint  Patient presents with   Medical Management of Chronic Issues    HPI   History of Present Illness   Angela Mcintyre is a 68 year old female with diabetes and hypertension who presents for a routine follow-up visit.  Glycemic control - Diabetes is well-controlled with an A1c of 5.8%. - Management includes Ozempic  and active dietary modifications with attention to sugar intake. - Occasional lightheadedness attributed to low blood sugar levels. - No severe symptoms of hypoglycemia. - Follows with endocrinology   Blood pressure management - Blood pressure readings at home have fluctuated, with lows of 99/60 mmHg, which is atypical for her. - Current antihypertensive regimen includes metoprolol  25 mg twice daily and hydrochlorothiazide  25 mg once daily. - Occasional lightheadedness, but no severe dizziness or symptoms of hypotension.  Hyperlipidemia management - Cholesterol is managed with crestor  and dietary monitoring. - Interest in checking cholesterol levels during this visit.  Mental health symptoms - On Zoloft  150 mg and lorazepam  for mood and anxiety management. - No suicidal ideation, despite inadvertently marking this on questionnaire. - Follows with psychiatry   Musculoskeletal symptoms - Recent fall on back porch due to slipping in the rain. - Resultant back pain, currently much improved. - Pain managed with Advil  and Flexeril . - She scuffed her right knee but abrasion is healing well and is without pain   Ophthalmologic symptoms - Painful sty on left eye, tender to touch. - Warm compresses have not provided relief.  - Come up yesterday and she woke up this morning with swelling and redness under her eye          ROS Per HPI.    Objective:     BP 121/73   Pulse 60   Temp 98 F (36.7 C) (Oral)   Ht 5'  4 (1.626 m)   Wt 195 lb 1.9 oz (88.5 kg)   SpO2 93%   BMI 33.49 kg/m    Physical Exam Constitutional:      General: She is not in acute distress.    Appearance: Normal appearance.  Eyes:     Comments: Left eye is red with stye present  Periorbital swelling and redness   Cardiovascular:     Rate and Rhythm: Normal rate and regular rhythm.     Heart sounds: Normal heart sounds. No murmur heard.    No friction rub. No gallop.  Pulmonary:     Effort: Pulmonary effort is normal. No respiratory distress.     Breath sounds: Normal breath sounds.  Abdominal:     General: Bowel sounds are normal.  Musculoskeletal:        General: No swelling.     Cervical back: Neck supple.  Lymphadenopathy:     Cervical: No cervical adenopathy.  Skin:    General: Skin is warm and dry.  Neurological:     General: No focal deficit present.     Mental Status: She is alert.  Psychiatric:        Mood and Affect: Mood normal.        Behavior: Behavior normal.        Thought Content: Thought content normal.      No results found for any visits on 08/18/24.  Last CBC Lab Results  Component Value Date   WBC 9.1 01/24/2024   HGB 16.4 (  H) 01/24/2024   HCT 50.5 (H) 01/24/2024   MCV 91 01/24/2024   MCH 29.7 01/24/2024   RDW 13.7 01/24/2024   PLT 325 01/24/2024   Last metabolic panel Lab Results  Component Value Date   GLUCOSE 99 01/24/2024   NA 141 01/24/2024   K 4.2 01/24/2024   CL 100 01/24/2024   CO2 25 01/24/2024   BUN 8 01/24/2024   CREATININE 0.77 01/24/2024   EGFR 84 01/24/2024   CALCIUM  9.5 01/24/2024   PROT 7.3 01/24/2024   ALBUMIN 4.6 01/24/2024   LABGLOB 2.7 01/24/2024   AGRATIO 1.7 12/14/2021   BILITOT 0.6 01/24/2024   ALKPHOS 114 01/24/2024   AST 15 01/24/2024   ALT 12 01/24/2024   Last lipids Lab Results  Component Value Date   CHOL 229 (H) 01/24/2024   HDL 46 01/24/2024   LDLCALC 166 (H) 01/24/2024   TRIG 96 01/24/2024   CHOLHDL 5.0 (H) 01/24/2024   Last  hemoglobin A1c Lab Results  Component Value Date   HGBA1C 5.8 (A) 06/25/2024   Last thyroid  functions Lab Results  Component Value Date   TSH 1.520 04/08/2023   Last vitamin D No results found for: 25OHVITD2, 25OHVITD3, VD25OH    The 10-year ASCVD risk score (Arnett DK, et al., 2019) is: 20.4%    Assessment & Plan:   Type 2 diabetes mellitus without complication, without long-term current use of insulin (HCC) Assessment & Plan: On Ozempic  and doing well. Most recent A1c 5.8. Continue regular follow ups with endocrinology. UACR, foot and eye exam all UTD.  Orders: -     Lipid panel; Future -     Comprehensive metabolic panel with GFR; Future -     CBC with Differential/Platelet; Future  Hyperlipidemia associated with type 2 diabetes mellitus (HCC) Assessment & Plan: Last lipid panel: LDL 166, HDL 46, Trig 96. The 10-year ASCVD risk score (Arnett DK, et al., 2019) is: 20.4% This lipid panel was before restarting Crestor  5 mg. She has been taking this and tolerating well. Collecting CMP and Lipid panel with labs. Will adjust medication dosage as indicated by results.   Orders: -     Lipid panel; Future -     Comprehensive metabolic panel with GFR; Future -     CBC with Differential/Platelet; Future  Encounter for colorectal cancer screening using Cologuard test -     Cologuard  Hordeolum externum left lower eyelid Assessment & Plan: Acute sty on the left eyelid, painful to touch. Warm compresses ineffective. No vision changes or contact lens use. - Prescribed erythromycin ointment twice daily.    Acute midline low back pain, unspecified whether sciatica present Assessment & Plan: Acute low back pain post-fall, improving. No imaging unless pain persists beyond four weeks. - Use Advil  and Flexeril  as needed for pain. - Reassess in two weeks if pain persists for possible imaging.   Chronic obstructive pulmonary disease, unspecified COPD type (HCC) Assessment &  Plan: Doing well on Trelegy and albuterol  as needed.    Generalized anxiety disorder Assessment & Plan: Managed with Zoloft  150 mg and lorazepam . No issues with efficacy or side effects. No suicidal ideation confirmed. - Follows with psychiatry - Dr. Curry   Hypertension associated with diabetes [facility]) Assessment & Plan: BP goal <130/80, at goal in office. Continue hydrochlorothiazide  25 mg daily, metoprolol  25 mg twice daily. CMP within normal limits.   No hypotension symptoms. Weight loss from Ozempic  may reduce medication need. - Monitor blood pressure at home. -  Contact clinic if blood pressure consistently low for possible medication adjustment.   Other orders -     Erythromycin; Place 1 Application into the left eye 2 (two) times daily.  Dispense: 3.5 g; Refill: 0      Return in about 6 months (around 02/15/2025) for HTN, HLD, DM, COPD .    Saddie JULIANNA Sacks, PA-C

## 2024-08-18 NOTE — Assessment & Plan Note (Signed)
 Acute sty on the left eyelid, painful to touch. Warm compresses ineffective. No vision changes or contact lens use. - Prescribed erythromycin ointment twice daily.

## 2024-08-18 NOTE — Assessment & Plan Note (Signed)
 Last lipid panel: LDL 166, HDL 46, Trig 96. The 10-year ASCVD risk score (Arnett DK, et al., 2019) is: 20.4% This lipid panel was before restarting Crestor  5 mg. She has been taking this and tolerating well. Collecting CMP and Lipid panel with labs. Will adjust medication dosage as indicated by results.

## 2024-08-18 NOTE — Assessment & Plan Note (Signed)
 Doing well on Trelegy and albuterol  as needed.

## 2024-08-18 NOTE — Assessment & Plan Note (Addendum)
 BP goal <130/80, at goal in office. Continue hydrochlorothiazide  25 mg daily, metoprolol  25 mg twice daily. CMP within normal limits.   No hypotension symptoms. Weight loss from Ozempic  may reduce medication need. - Monitor blood pressure at home. - Contact clinic if blood pressure consistently low for possible medication adjustment.

## 2024-08-19 ENCOUNTER — Other Ambulatory Visit

## 2024-08-19 DIAGNOSIS — E119 Type 2 diabetes mellitus without complications: Secondary | ICD-10-CM

## 2024-08-19 DIAGNOSIS — E1169 Type 2 diabetes mellitus with other specified complication: Secondary | ICD-10-CM

## 2024-08-20 ENCOUNTER — Ambulatory Visit: Payer: Self-pay

## 2024-08-20 LAB — CBC WITH DIFFERENTIAL/PLATELET
Basophils Absolute: 0 x10E3/uL (ref 0.0–0.2)
Basos: 0 %
EOS (ABSOLUTE): 0.4 x10E3/uL (ref 0.0–0.4)
Eos: 5 %
Hematocrit: 45.5 % (ref 34.0–46.6)
Hemoglobin: 15.2 g/dL (ref 11.1–15.9)
Immature Grans (Abs): 0 x10E3/uL (ref 0.0–0.1)
Immature Granulocytes: 0 %
Lymphocytes Absolute: 2 x10E3/uL (ref 0.7–3.1)
Lymphs: 25 %
MCH: 30.5 pg (ref 26.6–33.0)
MCHC: 33.4 g/dL (ref 31.5–35.7)
MCV: 91 fL (ref 79–97)
Monocytes Absolute: 0.6 x10E3/uL (ref 0.1–0.9)
Monocytes: 7 %
Neutrophils Absolute: 5.1 x10E3/uL (ref 1.4–7.0)
Neutrophils: 62 %
Platelets: 234 x10E3/uL (ref 150–450)
RBC: 4.98 x10E6/uL (ref 3.77–5.28)
RDW: 12.8 % (ref 11.7–15.4)
WBC: 8.1 x10E3/uL (ref 3.4–10.8)

## 2024-08-20 LAB — COMPREHENSIVE METABOLIC PANEL WITH GFR
ALT: 13 IU/L (ref 0–32)
AST: 19 IU/L (ref 0–40)
Albumin: 4.5 g/dL (ref 3.9–4.9)
Alkaline Phosphatase: 88 IU/L (ref 49–135)
BUN/Creatinine Ratio: 22 (ref 12–28)
BUN: 15 mg/dL (ref 8–27)
Bilirubin Total: 0.5 mg/dL (ref 0.0–1.2)
CO2: 28 mmol/L (ref 20–29)
Calcium: 9.7 mg/dL (ref 8.7–10.3)
Chloride: 97 mmol/L (ref 96–106)
Creatinine, Ser: 0.68 mg/dL (ref 0.57–1.00)
Globulin, Total: 2.5 g/dL (ref 1.5–4.5)
Glucose: 109 mg/dL — ABNORMAL HIGH (ref 70–99)
Potassium: 4.1 mmol/L (ref 3.5–5.2)
Sodium: 139 mmol/L (ref 134–144)
Total Protein: 7 g/dL (ref 6.0–8.5)
eGFR: 94 mL/min/1.73 (ref >59)

## 2024-08-20 LAB — LIPID PANEL
Chol/HDL Ratio: 2.8 ratio (ref 0.0–4.4)
Cholesterol, Total: 142 mg/dL (ref 100–199)
HDL: 50 mg/dL (ref >39)
LDL Chol Calc (NIH): 76 mg/dL (ref 0–99)
Triglycerides: 82 mg/dL (ref 0–149)
VLDL Cholesterol Cal: 16 mg/dL (ref 5–40)

## 2024-09-24 ENCOUNTER — Encounter (INDEPENDENT_AMBULATORY_CARE_PROVIDER_SITE_OTHER): Payer: Self-pay | Admitting: Otolaryngology

## 2024-09-24 ENCOUNTER — Ambulatory Visit (INDEPENDENT_AMBULATORY_CARE_PROVIDER_SITE_OTHER): Admitting: Otolaryngology

## 2024-09-24 VITALS — BP 159/85 | HR 75 | Ht 64.0 in | Wt 195.0 lb

## 2024-09-24 DIAGNOSIS — H938X1 Other specified disorders of right ear: Secondary | ICD-10-CM | POA: Diagnosis not present

## 2024-09-24 DIAGNOSIS — H8101 Meniere's disease, right ear: Secondary | ICD-10-CM

## 2024-09-24 DIAGNOSIS — H6121 Impacted cerumen, right ear: Secondary | ICD-10-CM

## 2024-09-24 DIAGNOSIS — H9313 Tinnitus, bilateral: Secondary | ICD-10-CM | POA: Diagnosis not present

## 2024-09-24 MED ORDER — PREDNISONE 20 MG PO TABS: 20.0000 mg | ORAL_TABLET | Freq: Every day | ORAL | 0 refills | Status: AC

## 2024-09-24 NOTE — Patient Instructions (Signed)
 Take 1 tablet daily for 5 days of 20mg  of Prednisone. Take first thing in morning.

## 2024-09-24 NOTE — Progress Notes (Signed)
 Dear Dr. Chandra, Here is my assessment for our mutual patient, Angela Mcintyre. Thank you for allowing me the opportunity to care for your patient. Please do not hesitate to contact me should you have any other questions. Sincerely, Dr. Eldora Blanch  Otolaryngology Clinic Note Referring provider: Dr. Chandra HPI:  Angela Mcintyre is a 68 y.o. female kindly referred by Dr. Chandra for evaluation of Meniere's disease/otalgia  Initial visit (09/2024): Discussed the use of AI scribe software for clinical note transcription with the patient, who gave verbal consent to proceed.  History of Present Illness Angela Mcintyre is a 68 year old female with reportedly longstanding right-sided Meniere's disease who presents with right ear fullness, dizziness, and impacted cerumen.  She was diagnosed with right-sided Meniere's disease [place], with initial severe vertigo episodes lasting five days or longer that required Ativan . Over time the episodes became less severe but persisted. Currently she will have episodes of imbalance and frequent dizziness triggered by turning corners, crowded or noisy environments, strong odors, and perfumes.  These dizziness episodes last three to five days that improve when she lays in a dark, quiet room without stimulation. These are associated with right-sided tinnitus but no aural fullness; she does have photophobia, anorexia, and severe diarrhea during these. She denies headache during episodes. Ativan  improves dizziness and shakiness, while meclizine  is ineffective. Denies frank vertigo or hearing change with these; some positions make it worse.  Currently not having this but her current right ear symptoms include a sense of impending otalgia, squishing sounds, and decreased hearing. She has recurrent impacted cerumen in the right ear which does help these symptoms in the past. She avoids water in the right ear and uses a cotton ball in the shower. She has no  ear surgeries and has not had an audiogram since her initial Meniere's diagnosis. She does not use cotton swabs because of the cough reflex. The left ear is asymptomatic.  She's on HCTZ  Takes ativan  for dizziness (does help with episodes) and Meclizine  as needed (does not work)  Patient currently denies: ear pain, vertigo, drainage Patient also denies barotrauma, vestibular suppressant use, ototoxic medication use Prior ear surgery: no No recent hearing test  ENT Surgery: no Personal or FHx of bleeding dz or anesthesia difficulty: no  GLP-1: yes AP/AC: no  Tobacco: former, quit  PMHx: T2DM, HTN, PTSD  Independent Review of Additional Tests or Records:  Dr. Chandra (06/12/2024): Noted meniere's flare with diarrhea?, triggered by attempt at removing cerumen; benzo helpful, meclizine  not helpful; Dx: Meniere's; Rx: ref to ENT Dr. Chandra (06/02/2024): noted right otalgia with feeling of ears feeling swollen; Dx: otalgia; Rx: amox, ref to ENT for manual cerumen removal CBC and CMP 08/19/2024: BUN/Cr 15/0.68, K 4.1; WBC 8.1, Eos 400  PMH/Meds/All/SocHx/FamHx/ROS:   Past Medical History:  Diagnosis Date   High blood pressure      Past Surgical History:  Procedure Laterality Date   CESAREAN SECTION  1973   CESAREAN SECTION      Family History  Problem Relation Age of Onset   Breast cancer Neg Hx      Social Connections: Socially Isolated (02/28/2024)   Social Connection and Isolation Panel    Frequency of Communication with Friends and Family: More than three times a week    Frequency of Social Gatherings with Friends and Family: More than three times a week    Attends Religious Services: Never    Production Manager of [employer]  or Organizations: No    Attends Banker Meetings: Never    Marital Status: Divorced     Current Medications[1]   Physical Exam:   BP (!) 159/85 (BP Location: Right Arm, Patient Position: Sitting, Cuff Size: Large)   Pulse 75   Ht 5' 4 (1.626  m)   Wt 195 lb (88.5 kg)   SpO2 (!) 88%   BMI 33.47 kg/m   Salient findings:  CN II-XII intact Right cerumen impaction; after clearance Bilateral EAC clear and TM intact with well pneumatized middle ear spaces  Weber 512: mid Rinne 512: AC > BC b/l  Anterior rhinoscopy: Septum intact; bilateral inferior turbinates without significant hypertrophy No lesions of oral cavity/oropharynx No obviously palpable neck masses/lymphadenopathy/thyromegaly No respiratory distress or stridor No gross nystagmus, gait not broad based; head shake ne  Seprately Identifiable Procedures:  Prior to initiating any procedures, risks/benefits/alternatives were explained to the patient and verbal consent obtained. Procedure: Bilateral ear microscopy and cerumen removal using microscope (CPT [phone]) - Mod 25 Pre-procedure diagnosis: Cerumen impaction right external ears Post-procedure diagnosis: same Indication: Right cerumen impaction; given patient's otologic complaints and history as well as for improved and comprehensive examination of external ear and tympanic membrane, bilateral otologic examination using microscope was performed and impacted cerumen removed  Procedure: Patient was placed semi-recumbent. Both ear canals were examined using the microscope with findings above. Impacted Cerumen removed on right using suction and currette with improvement in EAC examination and patency. Patient tolerated the procedure well.      Impression & Plans:  Angela Mcintyre is a 68 y.o. female with:  1. Meniere's disease of right ear   2. Impacted cerumen of right ear   3. Bilateral tinnitus   4. Sensation of fullness in right ear    Her symptoms of dizziness do not sound like classic meniere's. There appears to at least be some overlap with vestibular migraine. No recent HT. Meclizine  does not help, but ativan  helps abort. As such, we discussed options including empiric Rx for meniere's (in case it is, though not  very likely) v/s ref to Neuro v/s formal vestib testing. Will do short course of pred due to recent exacerbation and get audio. She did not wish for vestib testing currently; after formal audio, consider Neuro ref. She's already on HCTZ  Cerumen impaction cleared - she reports sx improved significantly after this. Baby oil to ear twice per week  F/u ~3 months with HT -- if acute attack, rec calling for audio ASAP  See below regarding exact medications prescribed this encounter including dosages and route: Meds ordered this encounter  Medications   predniSONE  (DELTASONE ) 20 MG tablet    Sig: Take 1 tablet (20 mg total) by mouth daily with breakfast for 5 days.    Dispense:  5 tablet    Refill:  0      Thank you for allowing me the opportunity to care for your patient. Please do not hesitate to contact me should you have any other questions.  Sincerely, Eldora Blanch, MD Otolaryngologist (ENT), [facility] Phone: [phone] Fax: [phone]  09/27/2024, 9:51 AM   MDM:  Level 4 - [phone] Complexity/Problems addressed: mod - chronic problems Data complexity: mod - independent review of note, labs, order test - Morbidity: mod  - Prescription Drug prescribed or managed: y      [1]  Current Outpatient Medications:    albuterol  (PROVENTIL ) (2.5 MG/3ML) 0.083% nebulizer solution, Use 1 vial nebulized every  6 hours as needed for shortness of breath, Disp: 75 mL, Rfl: 12   albuterol  (VENTOLIN  HFA) 108 (90 Base) MCG/ACT inhaler, Inhale 2 puffs into the lungs every 6 (six) hours as needed for wheezing., Disp: 2 each, Rfl: 11   Blood Glucose Monitoring Suppl DEVI, 1 each by Does not apply route in the morning, at noon, and at bedtime. May substitute to any manufacturer covered by patient's insurance., Disp: 1 each, Rfl: 0   cyclobenzaprine  (FLEXERIL ) 10 MG tablet, TAKE 1 TABLET BY MOUTH AT BEDTIME AS NEEDED FOR MUSCLE PAIN/ SPASMS, Disp: 30 tablet, Rfl: 4   erythromycin   ophthalmic ointment, Place 1 Application into the left eye 2 (two) times daily., Disp: 3.5 g, Rfl: 0   Fluticasone -Umeclidin-Vilant (TRELEGY ELLIPTA ) 100-62.5-25 MCG/ACT AEPB, Inhale 1 puff into the lungs daily., Disp: 60 each, Rfl: 11   Glucose Blood (BLOOD GLUCOSE TEST STRIPS) STRP, 1 each by In Vitro route every morning. May substitute to any manufacturer covered by patient's insurance., Disp: 100 strip, Rfl: 3   hydrochlorothiazide  (HYDRODIURIL ) 25 MG tablet, TAKE 1 TABLET (25 MG TOTAL) BY MOUTH DAILY., Disp: 30 tablet, Rfl: 4   LORazepam  (ATIVAN ) 0.5 MG tablet, Take 1 tablet (0.5 mg total) by mouth at bedtime., Disp: 30 tablet, Rfl: 2   meclizine  (ANTIVERT ) 25 MG tablet, Take 1 tablet (25 mg total) by mouth 3 (three) times daily as needed for dizziness., Disp: 30 tablet, Rfl: 0   metoprolol  tartrate (LOPRESSOR ) 25 MG tablet, TAKE 1 TABLET BY MOUTH 2 TIMES DAILY WITH A MEAL., Disp: 60 tablet, Rfl: 4   predniSONE  (DELTASONE ) 20 MG tablet, Take 1 tablet (20 mg total) by mouth daily with breakfast for 5 days., Disp: 5 tablet, Rfl: 0   rosuvastatin  (CRESTOR ) 5 MG tablet, TAKE 1 TABLET (5 MG TOTAL) BY MOUTH DAILY., Disp: 30 tablet, Rfl: 4   Semaglutide ,0.25 or 0.5MG /DOS, (OZEMPIC , 0.25 OR 0.5 MG/DOSE,) 2 MG/3ML SOPN, Inject 0.5 mg into the skin once a week., Disp: 9 mL, Rfl: 3   sertraline  (ZOLOFT ) 100 MG tablet, Take 1.5 tablets (150 mg total) by mouth daily., Disp: 45 tablet, Rfl: 2

## 2024-09-28 ENCOUNTER — Encounter (HOSPITAL_COMMUNITY): Payer: Self-pay | Admitting: Psychiatry

## 2024-09-28 ENCOUNTER — Telehealth (HOSPITAL_COMMUNITY): Admitting: Psychiatry

## 2024-09-28 VITALS — Wt 195.0 lb

## 2024-09-28 DIAGNOSIS — F418 Other specified anxiety disorders: Secondary | ICD-10-CM

## 2024-09-28 DIAGNOSIS — F4310 Post-traumatic stress disorder, unspecified: Secondary | ICD-10-CM

## 2024-09-28 DIAGNOSIS — H8109 Meniere's disease, unspecified ear: Secondary | ICD-10-CM

## 2024-09-28 MED ORDER — SERTRALINE HCL 100 MG PO TABS: 150.0000 mg | ORAL_TABLET | Freq: Every day | ORAL | 2 refills | Status: AC

## 2024-09-28 MED ORDER — LORAZEPAM 0.5 MG PO TABS: 0.5000 mg | ORAL_TABLET | Freq: Every day | ORAL | 2 refills | Status: AC
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
## 2024-10-05 ENCOUNTER — Telehealth: Payer: Self-pay | Admitting: Pharmacist

## 2024-10-05 ENCOUNTER — Other Ambulatory Visit: Payer: Self-pay

## 2024-10-05 DIAGNOSIS — G8929 Other chronic pain: Secondary | ICD-10-CM

## 2024-10-05 NOTE — Progress Notes (Signed)
 Pharmacy Quality Measure Review  This patient is appearing on a report for being at risk of failing the adherence measure for cholesterol (statin) medications this calendar year.   Medication: rosuvastatin  5 mg  Last fill date: 11/25 for 30 day supply  Appears several chronic medications are filled regularly for a 30 day supply, indicating med sync. No action needed at this time.   Catie [facility], PharmD, [facility] Clinical Pharmacist [phone]

## 2024-10-06 ENCOUNTER — Telehealth: Admitting: Family Medicine

## 2024-10-06 DIAGNOSIS — J069 Acute upper respiratory infection, unspecified: Secondary | ICD-10-CM | POA: Diagnosis not present

## 2024-10-06 DIAGNOSIS — H6691 Otitis media, unspecified, right ear: Secondary | ICD-10-CM | POA: Diagnosis not present

## 2024-10-06 MED ORDER — AMOXICILLIN-POT CLAVULANATE 875-125 MG PO TABS: 1.0000 | ORAL_TABLET | Freq: Two times a day (BID) | ORAL | 0 refills | Status: AC

## 2024-10-06 MED ORDER — PROMETHAZINE-DM 6.25-15 MG/5ML PO SYRP: 5.0000 mL | ORAL_SOLUTION | Freq: Four times a day (QID) | ORAL | 0 refills | Status: AC | PRN

## 2024-10-06 NOTE — Patient Instructions (Signed)
 Otitis Media, Adult  Otitis media occurs when there is inflammation and fluid in the middle ear with signs and symptoms of an acute infection. The middle ear is a part of the ear that contains bones for hearing as well as air that helps send sounds to the brain. When infected fluid builds up in this space, it causes pressure and can lead to an ear infection. The eustachian tube connects the middle ear to the back of the nose (nasopharynx) and normally allows air into the middle ear. If the eustachian tube becomes blocked, fluid can build up and become infected. What are the causes? This condition is caused by a blockage in the eustachian tube. This can be caused by mucus or by swelling of the tube. Problems that can cause a blockage include: A cold or other upper respiratory infection. Allergies. An irritant, such as tobacco smoke. Enlarged adenoids. The adenoids are areas of soft tissue located high in the back of the throat, behind the nose and the roof of the mouth. They are part of the body's defense system (immune system). A mass in the nasopharynx. Damage to the ear caused by pressure changes (barotrauma). What increases the risk? You are more likely to develop this condition if you: Smoke or are exposed to tobacco smoke. Have an opening in the roof of your mouth (cleft palate). Have gastroesophageal reflux. Have an immune system disorder. What are the signs or symptoms? Symptoms of this condition include: Ear pain. Fever. Decreased hearing. Tiredness (lethargy). Fluid leaking from the ear, if the eardrum is ruptured or has burst. Ringing in the ear. How is this diagnosed?  This condition is diagnosed with a physical exam. During the exam, your health care provider will use an instrument called an otoscope to look in your ear and check for redness, swelling, and fluid. He or she will also ask about your symptoms. Your health care provider may also order tests, such as: A pneumatic  otoscopy. This is a test to check the movement of the eardrum. It is done by squeezing a small amount of air into the ear. A tympanogram. This is a test that shows how well the eardrum moves in response to air pressure in the ear canal. It provides a graph for your health care provider to review. How is this treated? This condition can go away on its own within 3-5 days. But if the condition is caused by a bacterial infection and does not go away on its own, or if it keeps coming back, your health care provider may: Prescribe antibiotic medicine to treat the infection. Prescribe or recommend medicines to control pain. Follow these instructions at home: Take over-the-counter and prescription medicines only as told by your health care provider. If you were prescribed an antibiotic medicine, take it as told by your health care provider. Do not stop taking the antibiotic even if you start to feel better. Keep all follow-up visits. This is important. Contact a health care provider if: You have bleeding from your nose. There is a lump on your neck. You are not feeling better in 5 days. You feel worse instead of better. Get help right away if: You have severe pain that is not controlled with medicine. You have swelling, redness, or pain around your ear. You have stiffness in your neck. A part of your face is not moving (paralyzed). The bone behind your ear (mastoid bone) is tender when you touch it. You develop a severe headache. Summary Otitis media  is redness, soreness, and swelling of the middle ear, usually resulting in pain and decreased hearing. This condition can go away on its own within 3-5 days. If the problem does not go away in 3-5 days, your health care provider may give you medicines to treat the infection. If you were prescribed an antibiotic medicine, take it as told by your health care provider. Follow all instructions that were given to you by your health care provider. This  information is not intended to replace advice given to you by your health care provider. Make sure you discuss any questions you have with your health care provider. Document Revised: 01/09/2021 Document Reviewed: 01/09/2021 Elsevier Patient Education  2024 [employer]Upper Respiratory Infection, Adult An upper respiratory infection (URI) is a common viral infection of the nose, throat, and upper air passages that lead to the lungs. The most common type of URI is the common cold. URIs usually get better on their own, without medical treatment. What are the causes? A URI is caused by a virus. You may catch a virus by: Breathing in droplets from an infected person's cough or sneeze. Touching something that has been exposed to the virus (is contaminated) and then touching your mouth, nose, or eyes. What increases the risk? You are more likely to get a URI if: You are very young or very old. You have close contact with others, such as at work, school, or a health care facility. You smoke. You have long-term (chronic) heart or lung disease. You have a weakened disease-fighting system (immune system). You have nasal allergies or asthma. You are experiencing a lot of stress. You have poor nutrition. What are the signs or symptoms? A URI usually involves some of the following symptoms: Runny or stuffy (congested) nose. Cough. Sneezing. Sore throat. Headache. Fatigue. Fever. Loss of appetite. Pain in your forehead, behind your eyes, and over your cheekbones (sinus pain). Muscle aches. Redness or irritation of the eyes. Pressure in the ears or face. How is this diagnosed? This condition may be diagnosed based on your medical history and symptoms, and a physical exam. Your health care provider may use a swab to take a mucus sample from your nose (nasal swab). This sample can be tested to determine what virus is causing the illness. How is this treated? URIs usually get better on their own  within 7-10 days. Medicines cannot cure URIs, but your health care provider may recommend certain medicines to help relieve symptoms, such as: Over-the-counter cold medicines. Cough suppressants. Coughing is a type of defense against infection that helps to clear the respiratory system, so take these medicines only as recommended by your health care provider. Fever-reducing medicines. Follow these instructions at home: Activity Rest as needed. If you have a fever, stay home from work or school until your fever is gone or until your health care provider says your URI cannot spread to other people (is no longer contagious). Your health care provider may have you wear a face mask to prevent your infection from spreading. Relieving symptoms Gargle with a mixture of salt and water 3-4 times a day or as needed. To make salt water, completely dissolve -1 tsp (3-6 g) of salt in 1 cup (237 mL) of warm water. Use a cool-mist humidifier to add moisture to the air. This can help you breathe more easily. Eating and drinking  Drink enough fluid to keep your urine pale yellow. Eat soups and other clear broths. General instructions  Take over-the-counter and prescription  medicines only as told by your health care provider. These include cold medicines, fever reducers, and cough suppressants. Do not use any products that contain nicotine or tobacco. These products include cigarettes, chewing tobacco, and vaping devices, such as e-cigarettes. If you need help quitting, ask your health care provider. Stay away from secondhand smoke. Stay up to date on all immunizations, including the yearly (annual) flu vaccine. Keep all follow-up visits. This is important. How to prevent the spread of infection to others URIs can be contagious. To prevent the infection from spreading: Wash your hands with soap and water for at least 20 seconds. If soap and water are not available, use hand sanitizer. Avoid touching your  mouth, face, eyes, or nose. Cough or sneeze into a tissue or your sleeve or elbow instead of into your hand or into the air.  Contact a health care provider if: You are getting worse instead of better. You have a fever or chills. Your mucus is brown or red. You have yellow or brown discharge coming from your nose. You have pain in your face, especially when you bend forward. You have swollen neck glands. You have pain while swallowing. You have white areas in the back of your throat. Get help right away if: You have shortness of breath that gets worse. You have severe or persistent: Headache. Ear pain. Sinus pain. Chest pain. You have chronic lung disease along with any of the following: Making high-pitched whistling sounds when you breathe, most often when you breathe out (wheezing). Prolonged cough (more than 14 days). Coughing up blood. A change in your usual mucus. You have a stiff neck. You have changes in your: Vision. Hearing. Thinking. Mood. These symptoms may be an emergency. Get help right away. Call 911. Do not wait to see if the symptoms will go away. Do not drive yourself to the hospital. Summary An upper respiratory infection (URI) is a common infection of the nose, throat, and upper air passages that lead to the lungs. A URI is caused by a virus. URIs usually get better on their own within 7-10 days. Medicines cannot cure URIs, but your health care provider may recommend certain medicines to help relieve symptoms. This information is not intended to replace advice given to you by your health care provider. Make sure you discuss any questions you have with your health care provider. Document Revised: 05/03/2021 Document Reviewed: 05/03/2021 Elsevier Patient Education  2024 [employer].

## 2024-10-06 NOTE — Progress Notes (Signed)
 " Virtual Visit Consent   Angela Mcintyre, you are scheduled for a virtual visit with a [facility] provider today. Just as with appointments in the office, your consent must be obtained to participate. Your consent will be active for this visit and any virtual visit you may have with one of our providers in the next 365 days. If you have a MyChart account, a copy of this consent can be sent to you electronically.  As this is a virtual visit, video technology does not allow for your provider to perform a traditional examination. This may limit your provider's ability to fully assess your condition. If your provider identifies any concerns that need to be evaluated in person or the need to arrange testing (such as labs, EKG, etc.), we will make arrangements to do so. Although advances in technology are sophisticated, we cannot ensure that it will always work on either your end or our end. If the connection with a video visit is poor, the visit may have to be switched to a telephone visit. With either a video or telephone visit, we are not always able to ensure that we have a secure connection.  By engaging in this virtual visit, you consent to the provision of healthcare and authorize for your insurance to be billed (if applicable) for the services provided during this visit. Depending on your insurance coverage, you may receive a charge related to this service.  I need to obtain your verbal consent now. Are you willing to proceed with your visit today? Angela Mcintyre has provided verbal consent on 10/06/2024 for a virtual visit (video or telephone). Angela Lamp, FNP  Date: 10/06/2024 5:35 PM   Virtual Visit via Video Note   I, Angela Mcintyre, connected with  Angela Mcintyre  ([id number], Oct 27, 1955) on 10/06/2024 at  5:45 PM EST by a video-enabled telemedicine application and verified that I am speaking with the correct person using two identifiers.  Location: Patient: Virtual Visit Location Patient:  Home Provider: Virtual Visit Location Provider: Home Office   I discussed the limitations of evaluation and management by telemedicine and the availability of in person appointments. The patient expressed understanding and agreed to proceed.    History of Present Illness: Angela Mcintyre is a 68 y.o. who identifies as a female who was assigned female at birth, and is being seen today for sore throat, cough, rt throat pain and rt ear pain. No fever. No exposure. Sx for 3 days. Headache. No nausea, vomiting or diarrhea. No drainage from ear. Has history of ear infections. Sx worsening.   HPI: HPI  Problems:  Patient Active Problem List   Diagnosis Date Noted   Hordeolum externum left lower eyelid 08/18/2024   Acute low back pain 08/18/2024   Acute otalgia, right 06/02/2024   Hyperlipidemia associated with type 2 diabetes mellitus (HCC) 02/13/2024   Chronic sciatica of right side 02/13/2024   Estrogen deficiency 12/30/2021   Meniere's disease 12/30/2021   Generalized anxiety disorder 12/30/2021   Chronic right shoulder pain 09/18/2021   Hypertension associated with diabetes (HCC) 09/18/2021   Type 2 diabetes mellitus without complication, without long-term current use of insulin (HCC) 09/18/2021   Thyroid  nodule 09/18/2021   Chronic obstructive pulmonary disease (HCC) 09/18/2021   Body mass index (BMI) of 37.0-37.9 in adult 09/18/2021    Allergies: Allergies[1] Medications: Current Medications[2]  Observations/Objective: Patient is well-developed, well-nourished in no acute distress.  Resting comfortably  at home.  Head is normocephalic, atraumatic.  No labored breathing.  Speech is clear and coherent with logical content.  Patient is alert and oriented at baseline.    Assessment and Plan: 1. Right otitis media, unspecified otitis media type (Primary)  2. Upper respiratory tract infection, unspecified type  Increase fluids, humidifier at night, tylenol or ibuprofen  as  directed. UC as needed.   Follow Up Instructions: I discussed the assessment and treatment plan with the patient. The patient was provided an opportunity to ask questions and all were answered. The patient agreed with the plan and demonstrated an understanding of the instructions.  A copy of instructions were sent to the patient via MyChart unless otherwise noted below.     The patient was advised to call back or seek an in-person evaluation if the symptoms worsen or if the condition fails to improve as anticipated.    Angela Salonga, FNP     [1]  Allergies Allergen Reactions   Gabapentin Swelling  [2]  Current Outpatient Medications:    albuterol  (PROVENTIL ) (2.5 MG/3ML) 0.083% nebulizer solution, Use 1 vial nebulized every 6 hours as needed for shortness of breath, Disp: 75 mL, Rfl: 12   albuterol  (VENTOLIN  HFA) 108 (90 Base) MCG/ACT inhaler, Inhale 2 puffs into the lungs every 6 (six) hours as needed for wheezing., Disp: 2 each, Rfl: 11   Blood Glucose Monitoring Suppl DEVI, 1 each by Does not apply route in the morning, at noon, and at bedtime. May substitute to any manufacturer covered by patient's insurance., Disp: 1 each, Rfl: 0   cyclobenzaprine  (FLEXERIL ) 10 MG tablet, TAKE 1 TABLET BY MOUTH AT BEDTIME AS NEEDED FOR MUSCLE PAIN/ SPASMS, Disp: 30 tablet, Rfl: 4   erythromycin  ophthalmic ointment, Place 1 Application into the left eye 2 (two) times daily., Disp: 3.5 g, Rfl: 0   Fluticasone -Umeclidin-Vilant (TRELEGY ELLIPTA ) 100-62.5-25 MCG/ACT AEPB, Inhale 1 puff into the lungs daily., Disp: 60 each, Rfl: 11   Glucose Blood (BLOOD GLUCOSE TEST STRIPS) STRP, 1 each by In Vitro route every morning. May substitute to any manufacturer covered by patient's insurance., Disp: 100 strip, Rfl: 3   hydrochlorothiazide  (HYDRODIURIL ) 25 MG tablet, TAKE 1 TABLET (25 MG TOTAL) BY MOUTH DAILY., Disp: 30 tablet, Rfl: 4   LORazepam  (ATIVAN ) 0.5 MG tablet, Take 1 tablet (0.5 mg total) by mouth at  bedtime., Disp: 30 tablet, Rfl: 2   meclizine  (ANTIVERT ) 25 MG tablet, Take 1 tablet (25 mg total) by mouth 3 (three) times daily as needed for dizziness., Disp: 30 tablet, Rfl: 0   metoprolol  tartrate (LOPRESSOR ) 25 MG tablet, TAKE 1 TABLET BY MOUTH 2 TIMES DAILY WITH A MEAL., Disp: 60 tablet, Rfl: 4   rosuvastatin  (CRESTOR ) 5 MG tablet, TAKE 1 TABLET (5 MG TOTAL) BY MOUTH DAILY., Disp: 30 tablet, Rfl: 4   Semaglutide ,0.25 or 0.5MG /DOS, (OZEMPIC , 0.25 OR 0.5 MG/DOSE,) 2 MG/3ML SOPN, Inject 0.5 mg into the skin once a week., Disp: 9 mL, Rfl: 3   sertraline  (ZOLOFT ) 100 MG tablet, Take 1.5 tablets (150 mg total) by mouth daily., Disp: 45 tablet, Rfl: 2  "

## 2024-10-19 ENCOUNTER — Telehealth: Payer: Self-pay

## 2024-10-19 ENCOUNTER — Ambulatory Visit

## 2024-10-19 VITALS — BP 150/82 | HR 63 | Temp 98.3°F | Ht 64.0 in | Wt 190.0 lb

## 2024-10-19 DIAGNOSIS — Z7985 Long-term (current) use of injectable non-insulin antidiabetic drugs: Secondary | ICD-10-CM

## 2024-10-19 DIAGNOSIS — E1159 Type 2 diabetes mellitus with other circulatory complications: Secondary | ICD-10-CM

## 2024-10-19 DIAGNOSIS — E119 Type 2 diabetes mellitus without complications: Secondary | ICD-10-CM | POA: Diagnosis not present

## 2024-10-19 DIAGNOSIS — E041 Nontoxic single thyroid nodule: Secondary | ICD-10-CM

## 2024-10-19 DIAGNOSIS — I152 Hypertension secondary to endocrine disorders: Secondary | ICD-10-CM | POA: Diagnosis not present

## 2024-10-19 DIAGNOSIS — J449 Chronic obstructive pulmonary disease, unspecified: Secondary | ICD-10-CM

## 2024-10-19 MED ORDER — OLMESARTAN MEDOXOMIL 20 MG PO TABS: 10.0000 mg | ORAL_TABLET | Freq: Every day | ORAL | 2 refills | Status: AC

## 2024-10-19 NOTE — Telephone Encounter (Signed)
 Copied from CRM [id number]. Topic: Clinical - Red Word Triage >> Oct 16, 2024 11:06 AM Olam RAMAN wrote: Red Word that prompted transfer to Nurse Triage:  Feeling very tired and her Oxygen level has been up and down, not sure if it is her BP REFUSED NT   Patient came in today to be seen by Saddie Sacks.

## 2024-10-19 NOTE — Assessment & Plan Note (Signed)
 BP goal <130/80, above goal in office and on recheck. Above goal at home and at recent dr appts. Uncontrolled HTN likely contributing to symptoms of fatigue and headaches.  - Continue hydrochlorothiazide  25 mg daily, metoprolol  25 mg twice daily.  - Prescribed olmesartan  10 mg daily, option to increase to 20 mg. - Instructed home blood pressure monitoring and reporting. - Provided blood pressure log and advised to check BP daily and drop log off at office in 2 weeks. Will do an in person OV in 3 months to reassess and check kidney function and K+.

## 2024-10-19 NOTE — Assessment & Plan Note (Signed)
 Oxygen saturation variable, 88-94% at home. O2 excellent today at 95%. Discussed O2 goals for patients with COPD. Discussed ensuring hands are warm when using pulse oximeter to ensure accurate O2 readings. Patient notes no changes to her baseline. Using trelegy and albuterol  as prescribed.  - Continue home oxygen saturation monitoring. - No immediate pulmonology referral at this time. Will reassess in 3 months

## 2024-10-19 NOTE — Patient Instructions (Signed)
 VISIT SUMMARY: Today, we addressed your fatigue, shakiness, and headaches, which may be related to your blood glucose levels. We also reviewed your blood pressure, diabetes management, and COPD. Additionally, we discussed your sleep quality and general health maintenance.  YOUR PLAN: HYPERTENSION: Your blood pressure is not well controlled with your current medications. -Start taking olmesartan  10 mg daily, this is a half tablet daily. You may increase the dose to 20 mg if needed. -Monitor your blood pressure at home and keep a log of your readings. -Report your blood pressure readings to us  regularly.  TYPE 2 DIABETES MELLITUS: Your A1c level is good, but your symptoms suggest possible low blood sugar at night. -We ordered an A1c test to check your blood sugar control. -Have a bedtime snack to help prevent low blood sugar at night. -Continue taking Ozempic  0.5 mg once a week. -Keep a log of your morning blood sugar readings.  CHRONIC OBSTRUCTIVE PULMONARY DISEASE (COPD): Your oxygen levels are variable but within normal range. You do not need to see a lung specialist right now. -Continue to monitor your oxygen levels at home so we can reassess at your next appointment   GENERAL HEALTH MAINTENANCE: You are up to date with your eye exams and have a Cologuard kit for colon cancer screening. -Make sure to complete the Cologuard test. -Continue with your routine health maintenance screenings.  If you have any problems before your next visit feel free to message me via MyChart (minor issues or questions) or call the office, otherwise you may reach out to schedule an office visit.  Thank you! Saddie Sacks, PA-C

## 2024-10-19 NOTE — Progress Notes (Signed)
 "  Acute Office Visit  Subjective:     Patient ID: Angela Mcintyre, female    DOB: Oct 24, 1955, 69 y.o.   MRN: [id number]  No chief complaint on file.   HPI  Discussed the use of AI scribe software for clinical note transcription with the patient, who gave verbal consent to proceed.  History of Present Illness   Angela Mcintyre is a 69 year old female with COPD who presents with fatigue, shakiness, and headaches.  Fatigue, shakiness, and headaches - Unusual fatigue present - Morning shakiness noted, raising concern for hypoglycemia - Daily headaches experienced - Concern regarding blood glucose levels contributing to symptoms  Diabetes management - On Ozempic  0.5 mg once weekly - Recent hemoglobin A1c of 5.8 - Monitors blood glucose levels at home periodically but has not been recording the readings   Hypoxemia and copd - History of COPD - Oxygen saturation fluctuates, occasionally dropping to 88% - Baseline oxygen saturation typically 90-94% - Oxygen levels decrease with increased physical activity and improve with rest - Past hospitalization related to hypoxemia - Denies any worsening SOB or wheezing.   Hypertension - Recent blood pressure reading of 159/85 mmHg - Currently taking metoprolol  25 mg twice daily and hydrochlorothiazide  25 mg once daily - No prior use of other antihypertensive medications - Does note intermittent headaches since all of these symptoms started - No chest pain, worsening SOB, vision changes, edema, etc.   Sleep quality - Improved sleep quality after obtaining a new mattress - Able to sleep through the night without frequent waking - Previously experienced nocturnal awakenings and snacking throughout the night , now sleeps longer without interruptions  - Symptoms of AM shakiness started around the time she got the new mattress and began sleeping through the night         ROS Per HPI     Objective:    BP (!) 150/82   Pulse 63   Temp  98.3 F (36.8 C) (Oral)   Ht 5' 4 (1.626 m)   Wt 190 lb 0.6 oz (86.2 kg)   SpO2 95%   BMI 32.62 kg/m    Physical Exam Constitutional:      General: She is not in acute distress.    Appearance: Normal appearance.  Cardiovascular:     Rate and Rhythm: Normal rate and regular rhythm.     Heart sounds: Normal heart sounds. No murmur heard.    No friction rub. No gallop.  Pulmonary:     Effort: Pulmonary effort is normal. No respiratory distress.     Breath sounds: Rhonchi (Scattered in upper lungs, minimal) present.  Musculoskeletal:        General: No swelling.  Skin:    General: Skin is warm and dry.  Neurological:     General: No focal deficit present.     Mental Status: She is alert.  Psychiatric:        Mood and Affect: Mood normal.        Behavior: Behavior normal.        Thought Content: Thought content normal.      No results found for any visits on 10/19/24.      Assessment & Plan:   Type 2 diabetes mellitus without complication, without long-term current use of insulin (HCC) Assessment & Plan: Most recent A1c 5.8. Recent AM 'shakiness' symptoms suggest possible nocturnal hypoglycemia. - Ordered A1c test. - Advised bedtime snack to prevent hypoglycemia. - Continue Ozempic  0.5 mg weekly. - Provided  blood sugar log and advised to check BG twice daily (before bed and fasting in AM). Log provided to write these down and advised to drop log off in about 2 weeks.   Orders: -     Comprehensive metabolic panel with GFR; Future -     Hemoglobin A1c; Future -     CBC with Differential/Platelet; Future  Hypertension associated with diabetes (HCC) Assessment & Plan: BP goal <130/80, above goal in office and on recheck. Above goal at home and at recent dr appts. Uncontrolled HTN likely contributing to symptoms of fatigue and headaches.  - Continue hydrochlorothiazide  25 mg daily, metoprolol  25 mg twice daily.  - Prescribed olmesartan  10 mg daily, option to increase  to 20 mg. - Instructed home blood pressure monitoring and reporting. - Provided blood pressure log and advised to check BP daily and drop log off at office in 2 weeks. Will do an in person OV in 3 months to reassess and check kidney function and K+.    Orders: -     Comprehensive metabolic panel with GFR; Future -     CBC with Differential/Platelet; Future  Thyroid  nodule Assessment & Plan: Follows with endocrinology for this with regular ultrasounds. Will order thyroid  panel for thoroughness due to fatigue and shakiness.   Orders: -     Thyroid  Panel With TSH; Future  Chronic obstructive pulmonary disease, unspecified COPD type (HCC) Assessment & Plan: Oxygen saturation variable, 88-94% at home. O2 excellent today at 95%. Discussed O2 goals for patients with COPD. Discussed ensuring hands are warm when using pulse oximeter to ensure accurate O2 readings. Patient notes no changes to her baseline. Using trelegy and albuterol  as prescribed.  - Continue home oxygen saturation monitoring. - No immediate pulmonology referral at this time. Will reassess in 3 months    Other orders -     Olmesartan  Medoxomil; Take 0.5 tablets (10 mg total) by mouth daily.  Dispense: 45 tablet; Refill: 2     Return in about 3 months (around 01/17/2025) for HTN, DM, HLD, COPD.  Saddie JULIANNA Sacks, PA-C  "

## 2024-10-19 NOTE — Assessment & Plan Note (Signed)
 Follows with endocrinology for this with regular ultrasounds. Will order thyroid  panel for thoroughness due to fatigue and shakiness.

## 2024-10-19 NOTE — Assessment & Plan Note (Signed)
 Most recent A1c 5.8. Recent AM 'shakiness' symptoms suggest possible nocturnal hypoglycemia. - Ordered A1c test. - Advised bedtime snack to prevent hypoglycemia. - Continue Ozempic  0.5 mg weekly. - Provided blood sugar log and advised to check BG twice daily (before bed and fasting in AM). Log provided to write these down and advised to drop log off in about 2 weeks.

## 2024-10-20 LAB — THYROID PANEL WITH TSH
Free Thyroxine Index: 1.6 (ref 1.2–4.9)
T3 Uptake Ratio: 25 % (ref 24–39)
T4, Total: 6.3 ug/dL (ref 4.5–12.0)
TSH: 1.64 u[IU]/mL (ref 0.450–4.500)

## 2024-10-20 LAB — COMPREHENSIVE METABOLIC PANEL WITH GFR
ALT: 11 IU/L (ref 0–32)
AST: 15 IU/L (ref 0–40)
Albumin: 4.2 g/dL (ref 3.9–4.9)
Alkaline Phosphatase: 98 IU/L (ref 49–135)
BUN/Creatinine Ratio: 16 (ref 12–28)
BUN: 10 mg/dL (ref 8–27)
Bilirubin Total: 0.4 mg/dL (ref 0.0–1.2)
CO2: 26 mmol/L (ref 20–29)
Calcium: 9.4 mg/dL (ref 8.7–10.3)
Chloride: 98 mmol/L (ref 96–106)
Creatinine, Ser: 0.64 mg/dL (ref 0.57–1.00)
Globulin, Total: 2.7 g/dL (ref 1.5–4.5)
Glucose: 89 mg/dL (ref 70–99)
Potassium: 4 mmol/L (ref 3.5–5.2)
Sodium: 140 mmol/L (ref 134–144)
Total Protein: 6.9 g/dL (ref 6.0–8.5)
eGFR: 96 mL/min/1.73

## 2024-10-20 LAB — CBC WITH DIFFERENTIAL/PLATELET
Basophils Absolute: 0 x10E3/uL (ref 0.0–0.2)
Basos: 0 %
EOS (ABSOLUTE): 0.3 x10E3/uL (ref 0.0–0.4)
Eos: 3 %
Hematocrit: 47.2 % — ABNORMAL HIGH (ref 34.0–46.6)
Hemoglobin: 15.2 g/dL (ref 11.1–15.9)
Immature Grans (Abs): 0.1 x10E3/uL (ref 0.0–0.1)
Immature Granulocytes: 1 %
Lymphocytes Absolute: 2.5 x10E3/uL (ref 0.7–3.1)
Lymphs: 25 %
MCH: 29.6 pg (ref 26.6–33.0)
MCHC: 32.2 g/dL (ref 31.5–35.7)
MCV: 92 fL (ref 79–97)
Monocytes Absolute: 0.5 x10E3/uL (ref 0.1–0.9)
Monocytes: 5 %
Neutrophils Absolute: 6.5 x10E3/uL (ref 1.4–7.0)
Neutrophils: 66 %
Platelets: 263 x10E3/uL (ref 150–450)
RBC: 5.14 x10E6/uL (ref 3.77–5.28)
RDW: 12.8 % (ref 11.7–15.4)
WBC: 9.9 x10E3/uL (ref 3.4–10.8)

## 2024-10-20 LAB — HEMOGLOBIN A1C
Est. average glucose Bld gHb Est-mCnc: 120 mg/dL
Hgb A1c MFr Bld: 5.8 % — ABNORMAL HIGH (ref 4.8–5.6)

## 2024-10-21 ENCOUNTER — Ambulatory Visit: Payer: Self-pay

## 2024-11-04 ENCOUNTER — Other Ambulatory Visit: Payer: Self-pay

## 2024-11-04 DIAGNOSIS — I10 Essential (primary) hypertension: Secondary | ICD-10-CM

## 2024-12-23 ENCOUNTER — Ambulatory Visit: Admitting: Internal Medicine

## 2025-02-15 ENCOUNTER — Ambulatory Visit

## 2025-02-23 ENCOUNTER — Ambulatory Visit (INDEPENDENT_AMBULATORY_CARE_PROVIDER_SITE_OTHER): Admitting: Audiology

## 2025-02-23 ENCOUNTER — Ambulatory Visit (INDEPENDENT_AMBULATORY_CARE_PROVIDER_SITE_OTHER): Admitting: Otolaryngology

## 2025-04-08 ENCOUNTER — Ambulatory Visit
